# Patient Record
Sex: Male | Born: 1960
Health system: Southern US, Community
[De-identification: ages and names within clinical notes are randomized; demographics above are authoritative.]

## PROBLEM LIST (undated history)

## (undated) DIAGNOSIS — E119 Type 2 diabetes mellitus without complications: Secondary | ICD-10-CM

## (undated) DIAGNOSIS — G4730 Sleep apnea, unspecified: Secondary | ICD-10-CM

## (undated) HISTORY — DX: Type 2 diabetes mellitus without complications: E11.9

## (undated) HISTORY — PX: ROTATOR CUFF REPAIR: SHX139

---

## 1998-03-23 ENCOUNTER — Emergency Department (HOSPITAL_COMMUNITY): Admission: EM | Admit: 1998-03-23 | Discharge: 1998-03-23 | Payer: Self-pay | Admitting: Emergency Medicine

## 2000-09-26 ENCOUNTER — Observation Stay (HOSPITAL_COMMUNITY): Admission: RE | Admit: 2000-09-26 | Discharge: 2000-09-27 | Payer: Self-pay | Admitting: Specialist

## 2001-01-11 ENCOUNTER — Encounter: Payer: Self-pay | Admitting: Family Medicine

## 2001-01-11 ENCOUNTER — Encounter: Admission: RE | Admit: 2001-01-11 | Discharge: 2001-01-11 | Payer: Self-pay | Admitting: Family Medicine

## 2001-09-05 ENCOUNTER — Ambulatory Visit (HOSPITAL_COMMUNITY): Admission: RE | Admit: 2001-09-05 | Discharge: 2001-09-05 | Payer: Self-pay | Admitting: Specialist

## 2001-09-05 ENCOUNTER — Encounter: Payer: Self-pay | Admitting: Specialist

## 2005-03-17 ENCOUNTER — Encounter: Admission: RE | Admit: 2005-03-17 | Discharge: 2005-03-17 | Payer: Self-pay | Admitting: Family Medicine

## 2006-02-06 ENCOUNTER — Encounter: Admission: RE | Admit: 2006-02-06 | Discharge: 2006-02-06 | Payer: Self-pay | Admitting: Family Medicine

## 2012-07-23 ENCOUNTER — Emergency Department (HOSPITAL_COMMUNITY)
Admission: EM | Admit: 2012-07-23 | Discharge: 2012-07-23 | Disposition: A | Discharge status: Home / Self Care | Payer: Self-pay | Attending: Emergency Medicine | Admitting: Emergency Medicine

## 2012-07-23 DIAGNOSIS — M25531 Pain in right wrist: Secondary | ICD-10-CM

## 2012-07-23 DIAGNOSIS — G5601 Carpal tunnel syndrome, right upper limb: Secondary | ICD-10-CM

## 2012-07-23 DIAGNOSIS — Y9269 Other specified industrial and construction area as the place of occurrence of the external cause: Secondary | ICD-10-CM | POA: Insufficient documentation

## 2012-07-23 DIAGNOSIS — G5600 Carpal tunnel syndrome, unspecified upper limb: Secondary | ICD-10-CM | POA: Insufficient documentation

## 2012-07-23 DIAGNOSIS — Y990 Civilian activity done for income or pay: Secondary | ICD-10-CM | POA: Insufficient documentation

## 2012-07-23 DIAGNOSIS — X503XXA Overexertion from repetitive movements, initial encounter: Secondary | ICD-10-CM | POA: Insufficient documentation

## 2012-07-23 MED ORDER — IBUPROFEN 600 MG PO TABS: 600.0000 mg | ORAL_TABLET | Freq: Four times a day (QID) | ORAL | Status: DC | PRN

## 2012-07-23 NOTE — ED Provider Notes (Signed)
History     CSN: [id number]  Arrival date & time 07/23/12  1003   First MD Initiated Contact with Patient 07/23/12 1013      Chief Complaint  Patient presents with  . Wrist Pain    (Consider location/radiation/quality/duration/timing/severity/associated sxs/prior treatment) HPI Comments: Patient presents emergency department with chief complaint of right wrist pain. He states that he has had similar pain in the past. He describes it as intermittent and throbbing. He denies any fall or mechanical trauma or injury. He states that he has a hard time making a fist, and using his hand like normal. 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 There is no swelling, redness or deformity to the wrist. He states his pain is mild to moderate, but is worsened with repetitive movement. He has not tried anything to alleviate his symptoms.  The history is provided by the patient. No language interpreter was used.    No past medical history on file.  No past surgical history on file.  No family history on file.  History  Substance Use Topics  . Smoking status: Not on file  . Smokeless tobacco: Not on file  . Alcohol Use: Not on file      Review of Systems  All other systems reviewed and are negative.    Allergies  Review of patient's allergies indicates not on file.  Home Medications  No current outpatient prescriptions on file.  BP 142/95  Pulse 88  Temp(Src) 98.2 F (36.8 C) (Oral)  Resp 18  SpO2 99%  Physical Exam  Nursing note and vitals reviewed. Constitutional: He is oriented to person, place, and time. He appears well-developed and well-nourished.  HENT:  Head: Normocephalic and atraumatic.  Eyes: Conjunctivae and EOM are normal.  Neck: Normal range of motion.  Cardiovascular: Normal rate.   Distal pulses intact, with brisk capillary  Pulmonary/Chest: Effort normal.  Abdominal: He exhibits no distension.  Musculoskeletal: Normal range  of motion.  Positive Tinel and Phalen sign. No bony abnormality or deformity, no pain on palpation of the bones, hand flexion is painful, but he has full range of motion  Neurological: He is alert and oriented to person, place, and time.  Skin: Skin is dry.  No signs of rash, for redness, or any sign of infection  Psychiatric: He has a normal mood and affect. His behavior is normal. Judgment and thought content normal.    ED Course  Procedures (including critical care time)  Labs Reviewed - No data to display No results found.   1. Wrist pain, right   2. Carpal tunnel syndrome, right       MDM  Patient with right wrist pain. I suspect that this is carpal tunnel, based on the patient's occupation, and a positive Phalen and Tinel test, also there is no bony tenderness. Treat the patient with ibuprofen, and a wrist cockup splint. Will have the patient followup with hand.        Roxy Horseman, PA-C 07/23/12 1046

## 2012-07-23 NOTE — ED Notes (Signed)
Pt reports right wrist pain that started yesterday.  Pt reports hx of same with no diagnosis. Pain is intermittent and throbbing.  Pt reports he cannot make fist and cant use hand as normal.  No swelling noted, no redness no obvious disformity.  Pt denies injury.  Pt alert oriented X4

## 2012-07-24 NOTE — ED Provider Notes (Signed)
Medical screening examination/treatment/procedure(s) were performed by non-physician practitioner and as supervising physician I was immediately available for consultation/collaboration.  Zayden Hahne T Cherrish Vitali, MD 07/24/12 0822 

## 2012-10-08 ENCOUNTER — Encounter (HOSPITAL_COMMUNITY): Payer: Self-pay | Admitting: Emergency Medicine

## 2012-10-08 ENCOUNTER — Emergency Department (HOSPITAL_COMMUNITY): Payer: Self-pay

## 2012-10-08 ENCOUNTER — Emergency Department (HOSPITAL_COMMUNITY)
Admission: EM | Admit: 2012-10-08 | Discharge: 2012-10-08 | Disposition: A | Discharge status: Home / Self Care | Payer: Self-pay | Attending: Emergency Medicine | Admitting: Emergency Medicine

## 2012-10-08 DIAGNOSIS — M1710 Unilateral primary osteoarthritis, unspecified knee: Secondary | ICD-10-CM | POA: Insufficient documentation

## 2012-10-08 DIAGNOSIS — F17200 Nicotine dependence, unspecified, uncomplicated: Secondary | ICD-10-CM | POA: Insufficient documentation

## 2012-10-08 DIAGNOSIS — M25469 Effusion, unspecified knee: Secondary | ICD-10-CM | POA: Insufficient documentation

## 2012-10-08 DIAGNOSIS — Z8669 Personal history of other diseases of the nervous system and sense organs: Secondary | ICD-10-CM | POA: Insufficient documentation

## 2012-10-08 DIAGNOSIS — M25462 Effusion, left knee: Secondary | ICD-10-CM

## 2012-10-08 DIAGNOSIS — M1712 Unilateral primary osteoarthritis, left knee: Secondary | ICD-10-CM

## 2012-10-08 HISTORY — DX: Sleep apnea, unspecified: G47.30

## 2012-10-08 MED ORDER — HYDROCODONE-ACETAMINOPHEN 5-325 MG PO TABS: ORAL_TABLET | ORAL | Status: DC

## 2012-10-08 MED ORDER — LIDOCAINE HCL (PF) 1 % IJ SOLN
5.0000 mL | Freq: Once | INTRAMUSCULAR | Status: AC
  Administered 2012-10-08: 5 mL via INTRADERMAL
  Filled 2012-10-08: qty 5

## 2012-10-08 MED ORDER — HYDROCODONE-ACETAMINOPHEN 5-325 MG PO TABS
2.0000 | ORAL_TABLET | Freq: Once | ORAL | Status: AC
  Administered 2012-10-08: 2 via ORAL
  Filled 2012-10-08: qty 2

## 2012-10-08 NOTE — ED Notes (Signed)
Did  wound care on pt's lft knee.10:26am JG.

## 2012-10-08 NOTE — ED Notes (Signed)
Pt reports increased left knee pain over past 2-3 months.  Pt denies injury.  Pt states over the past 2 days the swelling has increased and pain 10/10.   Pt taken aleve with little relief.  Pt alert oriented X4

## 2012-10-08 NOTE — Discharge Instructions (Signed)

## 2012-10-08 NOTE — ED Provider Notes (Signed)
History     CSN: [id number]  Arrival date & time 10/08/12  0804   First MD Initiated Contact with Patient 10/08/12 [phone]      Chief Complaint  Patient presents with  . Knee Pain    (Consider location/radiation/quality/duration/timing/severity/associated sxs/prior treatment) Patient is a 52 y.o. male presenting with knee pain. The history is provided by the patient.  Knee Pain Location:  Knee Time since incident:  2 months Injury: no   Knee location:  L knee Pain details:    Quality:  Sharp and aching   Radiates to:  Does not radiate   Severity:  Severe   Onset quality:  Gradual   Timing:  Constant   Progression:  Worsening Chronicity:  New Dislocation: no   Prior injury to area:  No Relieved by:  Rest Worsened by:  Activity, bearing weight, flexion and extension Ineffective treatments:  NSAIDs Associated symptoms: decreased ROM and swelling   Associated symptoms: no back pain, no fever, no muscle weakness and no numbness   Risk factors: no concern for non-accidental trauma, no frequent fractures, no known bone disorder and no recent illness     Past Medical History  Diagnosis Date  . Sleep apnea     No past surgical history on file.  No family history on file.  History  Substance Use Topics  . Smoking status: Current Every Day Smoker    Types: Cigarettes  . Smokeless tobacco: Not on file  . Alcohol Use: Not on file      Review of Systems  Constitutional: Negative for fever.  Musculoskeletal: Positive for joint swelling and arthralgias. Negative for back pain.  Skin: Negative for color change, rash and wound.  Neurological: Negative for weakness and numbness.    Allergies  Review of patient's allergies indicates no known allergies.  Home Medications   Current Outpatient Rx  Name  Route  Sig  Dispense  Refill  . naproxen sodium (ALEVE) 220 MG tablet   Oral   Take 440 mg by mouth once.         Marland Kitchen HYDROcodone-acetaminophen (NORCO/VICODIN) 5-325  MG per tablet      1-2 tablets po q 6 hours prn moderate to severe pain   15 tablet   0     BP 127/72  Pulse 68  Temp(Src) 98 F (36.7 C) (Oral)  Resp 16  SpO2 95%  Physical Exam  Nursing note and vitals reviewed. Constitutional: He appears well-developed and well-nourished. No distress.  HENT:  Head: Normocephalic and atraumatic.  Cardiovascular:  Pulses:      Dorsalis pedis pulses are 2+ on the left side.  Pulmonary/Chest: Effort normal. No respiratory distress.  Musculoskeletal:       Left knee: He exhibits decreased range of motion, swelling and effusion. He exhibits no deformity, no laceration, no erythema and normal alignment. Tenderness found.  Neurological: He is alert. He exhibits normal muscle tone. Coordination normal.  Skin: Skin is warm and dry. No rash noted. He is not diaphoretic. No erythema. No pallor.    ED Course  ARTHOCENTESIS Date/Time: 10/08/2012 10:26 AM Performed by: Lear Ng Authorized by: Lear Ng Consent: Verbal consent obtained. Risks and benefits: risks, benefits and alternatives were discussed Consent given by: patient Patient understanding: patient states understanding of the procedure being performed Patient consent: the patient's understanding of the procedure matches consent given Procedure consent: procedure consent matches procedure scheduled Patient identity confirmed: verbally with patient Time out: Immediately prior to procedure a "  time out" was called to verify the correct patient, procedure, equipment, support staff and site/side marked as required. Indications: joint swelling and pain  Body area: knee Joint: left knee Local anesthesia used: yes Anesthesia: local infiltration Local anesthetic: lidocaine 1% without epinephrine Anesthetic total: 3 ml Patient sedated: no Preparation: Patient was prepped and draped in the usual sterile fashion. Needle gauge: 18 G Approach: lateral Aspirate: serous and  yellow Aspirate amount: 60 ml Patient tolerance: Patient tolerated the procedure well with no immediate complications.   (including critical care time)  Labs Reviewed - No data to display Dg Knee Complete 4 Views Left  10/08/2012   *RADIOLOGY REPORT*  Clinical Data: Knee pain for 2 weeks with swelling. No trauma history submitted.  LEFT KNEE - COMPLETE 4+ VIEW  Comparison: None.  Findings: Mild degenerative irregularity of the patellofemoral and lateral compartments.  Minimal medial compartment osteoarthritis. Moderate suprapatellar joint effusion.  Enthesopathic change at the quadriceps insertion.  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   Original Report Authenticated By: Jeronimo Greaves, M.D.     1. Knee effusion, left   2. Arthritis of knee, left     ra sat is 96% and I interpret to be normal   10:27 AM After arthrocentesis, pt feels much improved.    MDM  Pt with atraumatic left knee pain, swelling, likely effusion.  Will recommend RICE at home, Rx for analgesic.  If large effusion on xray is confirmed, will perform arthrocentesis for pain relief.          Gavin Pound. Austine Kelsay, MD 10/08/12 1030

## 2012-10-22 ENCOUNTER — Emergency Department (HOSPITAL_COMMUNITY)
Admission: EM | Admit: 2012-10-22 | Discharge: 2012-10-22 | Disposition: A | Discharge status: Home / Self Care | Payer: Self-pay | Attending: Emergency Medicine | Admitting: Emergency Medicine

## 2012-10-22 ENCOUNTER — Encounter (HOSPITAL_COMMUNITY): Payer: Self-pay | Admitting: Emergency Medicine

## 2012-10-22 DIAGNOSIS — Z791 Long term (current) use of non-steroidal anti-inflammatories (NSAID): Secondary | ICD-10-CM | POA: Insufficient documentation

## 2012-10-22 DIAGNOSIS — M25462 Effusion, left knee: Secondary | ICD-10-CM

## 2012-10-22 DIAGNOSIS — M25469 Effusion, unspecified knee: Secondary | ICD-10-CM | POA: Insufficient documentation

## 2012-10-22 DIAGNOSIS — F17200 Nicotine dependence, unspecified, uncomplicated: Secondary | ICD-10-CM | POA: Insufficient documentation

## 2012-10-22 MED ORDER — TRIAMCINOLONE ACETONIDE 40 MG/ML IJ SUSP
40.0000 mg | Freq: Once | INTRAMUSCULAR | Status: AC
  Administered 2012-10-22: 40 mg via INTRA_ARTICULAR
  Filled 2012-10-22: qty 1

## 2012-10-22 MED ORDER — HYDROCODONE-ACETAMINOPHEN 5-325 MG PO TABS
2.0000 | ORAL_TABLET | Freq: Once | ORAL | Status: AC
  Administered 2012-10-22: 2 via ORAL
  Filled 2012-10-22: qty 2

## 2012-10-22 MED ORDER — NAPROXEN 500 MG PO TABS: 500.0000 mg | ORAL_TABLET | Freq: Two times a day (BID) | ORAL | Status: DC

## 2012-10-22 MED ORDER — LIDOCAINE HCL 2 % IJ SOLN
10.0000 mL | Freq: Once | INTRAMUSCULAR | Status: AC
  Administered 2012-10-22: 200 mg
  Filled 2012-10-22: qty 20

## 2012-10-22 NOTE — ED Provider Notes (Signed)
History    This chart was scribed for non-physician practitioner Dierdre Forth, PA working with Gerhard Munch, MD by Quintella Reichert, ED Scribe. This patient was seen in room TR05C/TR05C and the patient's care was started at 4:33 PM   CSN: [id number]  Arrival date & time 10/22/12  1419    Chief Complaint  Patient presents with  . Knee Pain    The history is provided by the patient. No language interpreter was used.     HPI Comments: John Martinez is a 52 y.o. male who presents to the Emergency Department complaining of gradual-onset, constant, gradually-worsening moderate left knee pain that began yesterday, with accompanying visible swelling.  Pain is described as throbbing and is exacerbated by straightening the leg and by bearing weight.  Pt has experienced similar pain intermittently for 5 months and was seen at the ED several weeks ago for the same pain.  At that visit he was diagnosed with an effusion and arthritis to the knee, and was treated with arthrocentesis, which he states relieved pain significantly.  He has not been taking anti-inflammatory medication since that visit; he has not followed up with an orthopedist since that visit either..  Pt notes that he stands up all day at work.  He denies h/o gout, gonorrhea or septic joint to his knowledge.    Past Medical History  Diagnosis Date  . Sleep apnea     History reviewed. No pertinent past surgical history.   No family history on file.   History  Substance Use Topics  . Smoking status: Current Every Day Smoker    Types: Cigarettes  . Smokeless tobacco: Not on file  . Alcohol Use: Not on file     Review of Systems  HENT: Negative for neck stiffness.   Musculoskeletal: Positive for joint swelling and arthralgias.  Skin: Negative for wound.  Neurological: Negative for numbness.  All other systems reviewed and are negative.      Allergies  Review of patient's allergies indicates no known  allergies.  Home Medications   Current Outpatient Rx  Name  Route  Sig  Dispense  Refill  . naproxen (NAPROSYN) 500 MG tablet   Oral   Take 1 tablet (500 mg total) by mouth 2 (two) times daily with a meal.   30 tablet   3     BP 127/82  Pulse 96  Temp(Src) 98.3 F (36.8 C) (Oral)  Resp 17  SpO2 95%  Physical Exam  Nursing note and vitals reviewed. Constitutional: He is oriented to person, place, and time. He appears well-developed and well-nourished. No distress.  HENT:  Head: Normocephalic and atraumatic.  Eyes: Conjunctivae are normal.  Neck: Normal range of motion.  Cardiovascular: Normal rate, regular rhythm and intact distal pulses.   Capillary refill <3 seconds  Pulmonary/Chest: Effort normal and breath sounds normal.  Musculoskeletal: He exhibits tenderness. He exhibits no edema.  Decreased ROM of left knee  Neurological: He is alert and oriented to person, place, and time. He exhibits normal muscle tone. Coordination normal.  Sensation Intact Strength 5/5 to left lower extremity, except for left knee which has strength 3/5 secondary to pain  Skin: Skin is warm and dry. No rash noted. He is not diaphoretic. No erythema.  No tenting of the skin No erythema or increased warmth of the left knee  Psychiatric: He has a normal mood and affect.    ED Course  ARTHOCENTESIS Date/Time: 10/22/2012 5:45 PM Performed by: Dierdre Forth Authorized  by: Dierdre Forth Consent: Verbal consent obtained. Risks and benefits: risks, benefits and alternatives were discussed Consent given by: patient Patient understanding: patient states understanding of the procedure being performed Patient consent: the patient's understanding of the procedure matches consent given Procedure consent: procedure consent matches procedure scheduled Relevant documents: relevant documents present and verified Site marked: the operative site was marked Imaging studies: imaging studies  available (from last encounter) Required items: required blood products, implants, devices, and special equipment available Patient identity confirmed: verbally with patient and arm band Time out: Immediately prior to procedure a "time out" was called to verify the correct patient, procedure, equipment, support staff and site/side marked as required. Indications: joint swelling and pain  Body area: knee Joint: left knee Local anesthesia used: yes Local anesthetic: co-phenylcaine spray Patient sedated: no Preparation: Patient was prepped and draped in the usual sterile fashion. Needle gauge: 18 G Approach: lateral Aspirate: serous and yellow Aspirate amount: 65 ml Triamcinolone amount: 40 mg Lidocaine 2% amount: 8 ml Patient tolerance: Patient tolerated the procedure well with no immediate complications.   (including critical care time)  DIAGNOSTIC STUDIES: Oxygen Saturation is 95% on room air, adequate by my interpretation.    COORDINATION OF CARE: 4:41 PM- Informed pt that symptoms are due to effusion.  Discussed treatment plan which includes arthrocentesis and Naprosyn with pt at bedside and pt agreed to plan.     Labs Reviewed - No data to display  No results found.  1. Knee effusion, left     MDM  John Martinez presents with recurrent knee effusion. Patient seen on 10/08/2012 with knee effusion and x-ray at that time showed mild 3 compartment osteoarthritis moderate joint effusion.  I personally reviewed the imaging tests through PACS system.  I reviewed available ER/hospitalization records through the EMR.  Patient without injury.  Pt with mild swelling to the joint spaces, knee swelling, tightness in the knee, and mildly restricted range of motion. Pt unable to perform full flexion of the knee.  Pt is without systemic symptoms, erythema or redness of the joint consistent with gout or septic joint.   Pain managed in ED. knee arthrocentesis performed with large aspirate  obtained and cortisone injection given. Patient with full range of motion including flexion and extension after cortisone administration. Patient with no history of diabetes. Pt advised to follow up with orthopedics if symptoms persist for further evaluation and treatment. Patient given brace while in ED, conservative therapy recommended and discussed. Patient will be dc home & is agreeable with above plan.  I have also discussed reasons to return immediately to the ER.  Patient expresses understanding and agrees with plan.  Dr. Gerhard Munch was consulted, evaluated this patient with me and agrees with the plan.       Dahlia Client Tishawn Friedhoff, PA-C 10/22/12 1752

## 2012-10-22 NOTE — ED Notes (Signed)
Pt. Stated, i got lt. Knee pain and swollen that started yesterday. Initial pain started 5 months ago.

## 2012-10-23 NOTE — ED Provider Notes (Signed)
  This was a shared visit with a mid-level provided (NP or PA).  Throughout the patient's course I was available for consultation/collaboration.  I saw the ECG (if appropriate), relevant labs and studies - I agree with the interpretation.  On my exam the patient was in no distress.  He did however have a notable left knee effusion.      Gerhard Munch, MD 10/23/12 [phone]

## 2013-01-06 ENCOUNTER — Emergency Department (HOSPITAL_COMMUNITY): Payer: Self-pay

## 2013-01-06 ENCOUNTER — Emergency Department (HOSPITAL_COMMUNITY)
Admission: EM | Admit: 2013-01-06 | Discharge: 2013-01-06 | Disposition: A | Discharge status: Home / Self Care | Payer: Self-pay | Attending: Emergency Medicine | Admitting: Emergency Medicine

## 2013-01-06 ENCOUNTER — Encounter (HOSPITAL_COMMUNITY): Payer: Self-pay | Admitting: Emergency Medicine

## 2013-01-06 DIAGNOSIS — F17200 Nicotine dependence, unspecified, uncomplicated: Secondary | ICD-10-CM | POA: Insufficient documentation

## 2013-01-06 DIAGNOSIS — M25462 Effusion, left knee: Secondary | ICD-10-CM

## 2013-01-06 DIAGNOSIS — M1710 Unilateral primary osteoarthritis, unspecified knee: Secondary | ICD-10-CM | POA: Insufficient documentation

## 2013-01-06 DIAGNOSIS — IMO0002 Reserved for concepts with insufficient information to code with codable children: Secondary | ICD-10-CM | POA: Insufficient documentation

## 2013-01-06 DIAGNOSIS — M1990 Unspecified osteoarthritis, unspecified site: Secondary | ICD-10-CM

## 2013-01-06 DIAGNOSIS — M25469 Effusion, unspecified knee: Secondary | ICD-10-CM | POA: Insufficient documentation

## 2013-01-06 DIAGNOSIS — Z79899 Other long term (current) drug therapy: Secondary | ICD-10-CM | POA: Insufficient documentation

## 2013-01-06 MED ORDER — TRIAMCINOLONE ACETONIDE 40 MG/ML IJ SUSP
40.0000 mg | Freq: Once | INTRAMUSCULAR | Status: AC
  Administered 2013-01-06: 40 mg via INTRA_ARTICULAR
  Filled 2013-01-06: qty 1

## 2013-01-06 MED ORDER — OXYCODONE-ACETAMINOPHEN 5-325 MG PO TABS
1.0000 | ORAL_TABLET | Freq: Once | ORAL | Status: AC
  Administered 2013-01-06: 1 via ORAL
  Filled 2013-01-06: qty 1

## 2013-01-06 MED ORDER — OXYCODONE-ACETAMINOPHEN 5-325 MG PO TABS: 1.0000 | ORAL_TABLET | ORAL | Status: DC | PRN

## 2013-01-06 NOTE — ED Notes (Signed)
Pt c/o left knee pain that is chronic in nature; pt denies injury but sts increased swelling; pt sts has had fluid drained off before

## 2013-01-06 NOTE — ED Notes (Signed)
Received report from off going RN

## 2013-01-06 NOTE — ED Provider Notes (Signed)
CSN: [id number]     Arrival date & time 01/06/13  1642 History   First MD Initiated Contact with Patient 01/06/13 1727     Chief Complaint  Patient presents with  . Knee Pain   (Consider location/radiation/quality/duration/timing/severity/associated sxs/prior Treatment) Patient is a 52 y.o. male presenting with knee pain. The history is provided by the patient. No language interpreter was used.  Knee Pain Location:  Knee Time since incident:  3 days Injury: no   Knee location:  L knee Pain details:    Quality:  Aching and burning   Radiates to:  Does not radiate   Severity:  Moderate   Onset quality:  Gradual Chronicity:  Recurrent Associated symptoms: swelling   Associated symptoms: no fever, no numbness and no tingling     Past Medical History  Diagnosis Date  . Sleep apnea    History reviewed. No pertinent past surgical history. History reviewed. No pertinent family history. History  Substance Use Topics  . Smoking status: Current Every Day Smoker    Types: Cigarettes  . Smokeless tobacco: Not on file  . Alcohol Use: Yes    Review of Systems  Constitutional: Negative for fever.  Musculoskeletal: Positive for joint swelling.  Neurological: Negative for weakness and numbness.  All other systems reviewed and are negative.    Allergies  Review of patient's allergies indicates no known allergies.  Home Medications   Current Outpatient Rx  Name  Route  Sig  Dispense  Refill  . naproxen (NAPROSYN) 500 MG tablet   Oral   Take 1 tablet (500 mg total) by mouth 2 (two) times daily with a meal.   30 tablet   3    BP 157/86  Pulse 100  Temp(Src) 98.6 F (37 C) (Oral)  Resp 18  SpO2 98% Physical Exam  Nursing note and vitals reviewed. Constitutional: He appears well-developed and well-nourished. No distress.  HENT:  Head: Normocephalic and atraumatic.  Eyes: Conjunctivae are normal. Pupils are equal, round, and reactive to light.  Cardiovascular: Regular  rhythm, normal heart sounds and intact distal pulses.   Pulmonary/Chest: Effort normal and breath sounds normal.  Musculoskeletal: Normal range of motion. He exhibits edema and tenderness.       Left knee: He exhibits swelling and effusion. He exhibits normal range of motion, no deformity and no erythema.    ED Course  ARTHOCENTESIS Date/Time: 01/06/2013 7:58 PM Performed by: Irish Elders Authorized by: Irish Elders Consent: Verbal consent obtained. Risks and benefits: risks, benefits and alternatives were discussed Consent given by: patient Patient understanding: patient states understanding of the procedure being performed Test results: test results available and properly labeled Site marked: the operative site was marked Imaging studies: imaging studies available Required items: required blood products, implants, devices, and special equipment available Patient identity confirmed: verbally with patient and arm band Time out: Immediately prior to procedure a "time out" was called to verify the correct patient, procedure, equipment, support staff and site/side marked as required. Indications: joint swelling  Body area: knee Joint: left knee Local anesthesia used: yes Anesthesia: local infiltration Local anesthetic: lidocaine 2% without epinephrine Anesthetic total: 3 ml Patient sedated: no Preparation: Patient was prepped and draped in the usual sterile fashion. Needle gauge: 18 G Approach: lateral Aspirate: serous Aspirate amount: 110 ml Triamcinolone amount: 40 mg Lidocaine 2% amount: 8 ml Patient tolerance: Patient tolerated the procedure well with no immediate complications.   (including critical care time) Labs Review Labs Reviewed - No data  to display Imaging Review Dg Knee Complete 4 Views Left  01/06/2013   *RADIOLOGY REPORT*  Clinical Data: Left knee pain and swelling.  LEFT KNEE - COMPLETE 4+ VIEW  Comparison: 10/08/2012  Findings: There is no fracture or  dislocation.  There is tricompartmental slight osteoarthritic spurring.  There is a prominent joint effusion.  IMPRESSION: Joint effusion.  Slight tricompartmental osteoarthritis.   Original Report Authenticated By: Francene Boyers, M.D.    MDM   1. Knee effusion, left   2. Osteoarthritis     Left knee effusion, recurrent. Good ROM, neurovascular intact. Ambulates without difficulty. Arthrocentesis uncomplicated with of clear, serous fluid withdrawn. Pt reports some reflief post procedure.  X-ray showed effusion and tricompartmental osteoarthritis. Referral and follow-up with Ortho, Dr. Francena Hanly.    Irish Elders, NP 01/06/13 2013

## 2013-01-09 NOTE — ED Provider Notes (Signed)
Medical screening examination/treatment/procedure(s) were performed by non-physician practitioner and as supervising physician I was immediately available for consultation/collaboration.    Leiby Pigeon J. Keysi Oelkers, MD 01/09/13 0434 

## 2013-07-26 ENCOUNTER — Ambulatory Visit: Payer: Self-pay | Attending: Internal Medicine

## 2013-10-04 ENCOUNTER — Ambulatory Visit: Payer: Self-pay | Admitting: Internal Medicine

## 2014-10-05 ENCOUNTER — Encounter (HOSPITAL_COMMUNITY): Payer: Self-pay | Admitting: Emergency Medicine

## 2014-10-05 ENCOUNTER — Emergency Department (HOSPITAL_COMMUNITY)
Admission: EM | Admit: 2014-10-05 | Discharge: 2014-10-06 | Disposition: A | Discharge status: Home / Self Care | Payer: Self-pay | Attending: Emergency Medicine | Admitting: Emergency Medicine

## 2014-10-05 DIAGNOSIS — Z720 Tobacco use: Secondary | ICD-10-CM | POA: Insufficient documentation

## 2014-10-05 DIAGNOSIS — Z8669 Personal history of other diseases of the nervous system and sense organs: Secondary | ICD-10-CM | POA: Insufficient documentation

## 2014-10-05 DIAGNOSIS — L259 Unspecified contact dermatitis, unspecified cause: Secondary | ICD-10-CM | POA: Insufficient documentation

## 2014-10-05 NOTE — ED Notes (Signed)
Pt from home with wife presents with "itchy bumps" all over both arms. 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 He reports he was wearing a short sleeved shirt while doing this and the skin on his arms were exposed to the dust and debris from the buildings.

## 2014-10-06 MED ORDER — DOXYCYCLINE HYCLATE 100 MG PO CAPS: 100.0000 mg | ORAL_CAPSULE | Freq: Two times a day (BID) | ORAL | Status: DC

## 2014-10-06 MED ORDER — DIPHENHYDRAMINE HCL 25 MG PO TABS: 25.0000 mg | ORAL_TABLET | Freq: Four times a day (QID) | ORAL | Status: DC

## 2014-10-06 MED ORDER — PREDNISONE 20 MG PO TABS: ORAL_TABLET | ORAL | Status: DC

## 2014-10-06 NOTE — Discharge Instructions (Signed)

## 2014-10-06 NOTE — ED Provider Notes (Signed)
CSN: [id number]     Arrival date & time 10/05/14  2348 History   First MD Initiated Contact with Patient 10/06/14 0010     Chief Complaint  Patient presents with  . Rash     (Consider location/radiation/quality/duration/timing/severity/associated sxs/prior Treatment) HPI   54 year old male with history of sleep apnea but no significant history of allergies presenting from home for evaluation of a rash. 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 This is a new job, he has been working for the past 3 weeks. Since starting a new job he has noticed a very itchy rash initially started in his right forearm but now he has noticed in both forearms, face and neck primary at a skin exposed area. He has tried Neosporin, camphor cream, and peroxide with minimal improvement. He denies having any fever, headache, throat swelling, trouble breathing, chest pain, shortness of breath, abdominal cramping, nausea vomiting diarrhea. Family member does not have similar rash. No other changes in diets, soap, or detergent, no new pets. He admits that he wear short-sleeved shirts while doing this and his skin does exposed to dust and debris from the building. No rash in his mouth, penis, rectum, trouble having bowel movement, or rashes lower extremities. He has not noticed any rash in the palms of his hands or soles of feet.  Past Medical History  Diagnosis Date  . Sleep apnea    Past Surgical History  Procedure Laterality Date  . Rotator cuff repair     History reviewed. No pertinent family history. History  Substance Use Topics  . Smoking status: Current Every Day Smoker    Types: Cigarettes  . Smokeless tobacco: Not on file  . Alcohol Use: Yes    Review of Systems  All other systems reviewed and are negative.     Allergies  Review of patient's allergies indicates no known allergies.  Home Medications   Prior to Admission medications   Medication Sig  Start Date End Date Taking? Authorizing Provider  ibuprofen (ADVIL,MOTRIN) 200 MG tablet Take 400 mg by mouth every 6 (six) hours as needed for moderate pain.   Yes Historical Provider, MD  neomycin-polymyxin-pramoxine (NEOSPORIN PLUS) 1 % cream Apply 1 application topically 3 (three) times daily as needed (wound card).   Yes Historical Provider, MD  Pramoxine-Benzalkonium Cl (FIRST AID ANTISEPTIC) 1-0.13 % LIQD Apply 1 application topically 3 (three) times daily as needed (wound card).   Yes Historical Provider, MD  oxyCODONE-acetaminophen (PERCOCET/ROXICET) 5-325 MG per tablet Take 1 tablet by mouth every 4 (four) hours as needed for pain. Patient not taking: Reported on 10/06/2014 01/06/13   Elisha Headland, NP   BP 157/93 mmHg  Pulse 81  Temp(Src) 97.8 F (36.6 C) (Oral)  Resp 18  SpO2 97% Physical Exam  Constitutional: He appears well-developed and well-nourished. No distress.  HENT:  Head: Atraumatic.  Mouth/Throat: Oropharynx is clear and moist.  Eyes: Conjunctivae are normal.  Neck: Neck supple.  Cardiovascular: Normal rate and regular rhythm.   Pulmonary/Chest: Effort normal and breath sounds normal. No respiratory distress. He has no wheezes.  Musculoskeletal: He exhibits edema (Left forearm and left hand edema secondary to rash.).  Neurological: He is alert.  Skin: Rash (Multiple small papular lesion noted throughout right and left forearm, face and neck in skin exposed area. Please refer to picture below.) noted.  Psychiatric: He has a normal mood and affect.  Nursing note and vitals reviewed.   ED Course  Procedures (including critical care time)  Patient here with a rash through skin exposed area consistence with contact dermatitis from recent job. I recommend avoid his environment. Patient will be placed on steroid, Benadryl, and antibiotic. Antibiotic given due to open skin at risk for cellulitis.  Pt recommend to trim nails short and avoid scratching. Dermatology referral  given as needed. Return precaution discussed. No evidence of anaphylactic reaction.          Labs Review Labs Reviewed - No data to display  Imaging Review No results found.   EKG Interpretation None      MDM   Final diagnoses:  Contact dermatitis    BP 157/93 mmHg  Pulse 81  Temp(Src) 97.8 F (36.6 C) (Oral)  Resp 18  SpO2 97%     Domenic Moras, PA-C 10/06/14 0030  Serita Grit, MD 10/06/14 [phone]

## 2014-10-24 ENCOUNTER — Emergency Department (HOSPITAL_COMMUNITY)
Admission: EM | Admit: 2014-10-24 | Discharge: 2014-10-25 | Disposition: A | Discharge status: Home / Self Care | Payer: Self-pay | Attending: Emergency Medicine | Admitting: Emergency Medicine

## 2014-10-24 ENCOUNTER — Encounter (HOSPITAL_COMMUNITY): Payer: Self-pay | Admitting: Emergency Medicine

## 2014-10-24 DIAGNOSIS — I10 Essential (primary) hypertension: Secondary | ICD-10-CM | POA: Insufficient documentation

## 2014-10-24 DIAGNOSIS — R739 Hyperglycemia, unspecified: Secondary | ICD-10-CM | POA: Insufficient documentation

## 2014-10-24 DIAGNOSIS — L259 Unspecified contact dermatitis, unspecified cause: Secondary | ICD-10-CM | POA: Insufficient documentation

## 2014-10-24 DIAGNOSIS — Z720 Tobacco use: Secondary | ICD-10-CM | POA: Insufficient documentation

## 2014-10-24 DIAGNOSIS — Z8669 Personal history of other diseases of the nervous system and sense organs: Secondary | ICD-10-CM | POA: Insufficient documentation

## 2014-10-24 DIAGNOSIS — Z792 Long term (current) use of antibiotics: Secondary | ICD-10-CM | POA: Insufficient documentation

## 2014-10-24 DIAGNOSIS — Z79899 Other long term (current) drug therapy: Secondary | ICD-10-CM | POA: Insufficient documentation

## 2014-10-24 LAB — CBC WITH DIFFERENTIAL/PLATELET
BASOS ABS: 0 10*3/uL (ref 0.0–0.1)
Basophils Relative: 0 % (ref 0–1)
EOS PCT: 4 % (ref 0–5)
Eosinophils Absolute: 0.2 10*3/uL (ref 0.0–0.7)
HCT: 39 % (ref 39.0–52.0)
Hemoglobin: 12.8 g/dL — ABNORMAL LOW (ref 13.0–17.0)
LYMPHS ABS: 1.8 10*3/uL (ref 0.7–4.0)
LYMPHS PCT: 29 % (ref 12–46)
MCH: 29.2 pg (ref 26.0–34.0)
MCHC: 32.8 g/dL (ref 30.0–36.0)
MCV: 89 fL (ref 78.0–100.0)
MONO ABS: 1.2 10*3/uL — AB (ref 0.1–1.0)
Monocytes Relative: 18 % — ABNORMAL HIGH (ref 3–12)
NEUTROS ABS: 3.1 10*3/uL (ref 1.7–7.7)
Neutrophils Relative %: 49 % (ref 43–77)
Platelets: 138 10*3/uL — ABNORMAL LOW (ref 150–400)
RBC: 4.38 MIL/uL (ref 4.22–5.81)
RDW: 14.3 % (ref 11.5–15.5)
WBC: 6.3 10*3/uL (ref 4.0–10.5)

## 2014-10-24 MED ORDER — MORPHINE SULFATE 2 MG/ML IJ SOLN
4.0000 mg | Freq: Once | INTRAMUSCULAR | Status: AC
  Administered 2014-10-25: 4 mg via INTRAVENOUS
  Filled 2014-10-24: qty 2

## 2014-10-24 MED ORDER — DIPHENHYDRAMINE HCL 50 MG/ML IJ SOLN
25.0000 mg | Freq: Once | INTRAMUSCULAR | Status: AC
  Administered 2014-10-25: 25 mg via INTRAVENOUS
  Filled 2014-10-24: qty 1

## 2014-10-24 MED ORDER — METHYLPREDNISOLONE SODIUM SUCC 125 MG IJ SOLR
125.0000 mg | Freq: Once | INTRAMUSCULAR | Status: AC
  Administered 2014-10-25: 125 mg via INTRAVENOUS
  Filled 2014-10-24: qty 2

## 2014-10-24 NOTE — ED Provider Notes (Signed)
Medical Screening Exam:  Pt with two weeks of bilateral arm and neck dermatitis, has taken doxycycline but unable to fill prednisone c/o continued bilateral arm weeping rash with itching and pain.   Pt with significant edema, weeping wet dermatitis (only left arm examined), pt appears uncomfortable.   Seen with nurse in triage, orders for IV steroids, pain medication, labs ordered.  Pt to be moved to main ED for full evaluation.    [place], PA-C 10/25/14 9833  Everlene Balls, MD 10/25/14 [phone]

## 2014-10-24 NOTE — ED Notes (Signed)
Bilateral fore arms wrapped in kling due to weeping

## 2014-10-24 NOTE — ED Notes (Signed)
Patient here with complaint of bilateral arm swelling, weeping, and pain. States that he may have been exposed to poison ivy or oak. Was seen at [facility] for similar complaint about 2 weeks ago was given prednisone and Doxycycline Rx. Patient filled and has taken most of the Abx, but did not take any of the prednisone as it was too expensive.

## 2014-10-25 LAB — BASIC METABOLIC PANEL
Anion gap: 10 (ref 5–15)
BUN: 11 mg/dL (ref 6–20)
CO2: 21 mmol/L — ABNORMAL LOW (ref 22–32)
CREATININE: 1.17 mg/dL (ref 0.61–1.24)
Calcium: 8.6 mg/dL — ABNORMAL LOW (ref 8.9–10.3)
Chloride: 105 mmol/L (ref 101–111)
GFR calc Af Amer: >60 mL/min (ref >60)
Glucose, Bld: 227 mg/dL — ABNORMAL HIGH (ref 65–99)
POTASSIUM: 3.6 mmol/L (ref 3.5–5.1)
Sodium: 136 mmol/L (ref 135–145)

## 2014-10-25 MED ORDER — HYDROXYZINE HCL 10 MG PO TABS
10.0000 mg | ORAL_TABLET | Freq: Once | ORAL | Status: AC
  Administered 2014-10-25: 10 mg via ORAL
  Filled 2014-10-25: qty 1

## 2014-10-25 MED ORDER — MORPHINE SULFATE 4 MG/ML IJ SOLN
4.0000 mg | Freq: Once | INTRAMUSCULAR | Status: AC
  Administered 2014-10-25: 4 mg via INTRAVENOUS
  Filled 2014-10-25: qty 1

## 2014-10-25 MED ORDER — PREDNISONE 10 MG PO TABS: ORAL_TABLET | ORAL | Status: DC

## 2014-10-25 MED ORDER — OXYCODONE-ACETAMINOPHEN 5-325 MG PO TABS: 1.0000 | ORAL_TABLET | ORAL | Status: DC | PRN

## 2014-10-25 MED ORDER — HYDROXYZINE HCL 10 MG PO TABS: 10.0000 mg | ORAL_TABLET | Freq: Four times a day (QID) | ORAL | Status: DC | PRN

## 2014-10-25 NOTE — ED Notes (Signed)
Jarrett Soho PA in room

## 2014-10-25 NOTE — Discharge Instructions (Signed)
1. Medications: Prednisone x 2 weeks, atarax or benadryl for itching, usual home medications 2. Treatment: rest, drink plenty of fluids, take medications as prescribed, use calamine lotion; keep wounds clean 3. Follow Up: Please followup with your primary doctor or [facility] as discussed in 2-3 days for discussion of your diagnoses and further evaluation after today's visit; if you do not have a primary care doctor use the resource guide provided to find one; followup with dermatology as needed; Return to the ER for difficulty breathing, return of allergic reaction or other concerning symptoms    Contact Dermatitis Contact dermatitis is a reaction to certain substances that touch the skin. Contact dermatitis can be either irritant contact dermatitis or allergic contact dermatitis. Irritant contact dermatitis does not require previous exposure to the substance for a reaction to occur.Allergic contact dermatitis only occurs if you have been exposed to the substance before. Upon a repeat exposure, your body reacts to the substance.  CAUSES  Many substances can cause contact dermatitis. Irritant dermatitis is most commonly caused by repeated exposure to mildly irritating substances, such as:  Makeup.  Soaps.  Detergents.  Bleaches.  Acids.  Metal salts, such as nickel. Allergic contact dermatitis is most commonly caused by exposure to:  Poisonous plants.  Chemicals (deodorants, shampoos).  Jewelry.  Latex.  Neomycin in triple antibiotic cream.  Preservatives in products, including clothing. SYMPTOMS  The area of skin that is exposed may develop:  Dryness or flaking.  Redness.  Cracks.  Itching.  Pain or a burning sensation.  Blisters. With allergic contact dermatitis, there may also be swelling in areas such as the eyelids, mouth, or genitals.  DIAGNOSIS  Your caregiver can usually tell what the problem is by doing a physical exam. In cases where the cause  is uncertain and an allergic contact dermatitis is suspected, a patch skin test may be performed to help determine the cause of your dermatitis. TREATMENT Treatment includes protecting the skin from further contact with the irritating substance by avoiding that substance if possible. Barrier creams, powders, and gloves may be helpful. Your caregiver may also recommend:  Steroid creams or ointments applied 2 times daily. For best results, soak the rash area in cool water for 20 minutes. Then apply the medicine. Cover the area with a plastic wrap. You can store the steroid cream in the refrigerator for a "chilly" effect on your rash. That may decrease itching. Oral steroid medicines may be needed in more severe cases.  Antibiotics or antibacterial ointments if a skin infection is present.  Antihistamine lotion or an antihistamine taken by mouth to ease itching.  Lubricants to keep moisture in your skin.  Burow's solution to reduce redness and soreness or to dry a weeping rash. Mix one packet or tablet of solution in 2 cups cool water. Dip a clean washcloth in the mixture, wring it out a bit, and put it on the affected area. Leave the cloth in place for 30 minutes. Do this as often as possible throughout the day.  Taking several cornstarch or baking soda baths daily if the area is too large to cover with a washcloth. Harsh chemicals, such as alkalis or acids, can cause skin damage that is like a burn. You should flush your skin for 15 to 20 minutes with cold water after such an exposure. You should also seek immediate medical care after exposure. Bandages (dressings), antibiotics, and pain medicine may be needed for severely irritated skin.  HOME CARE INSTRUCTIONS  Avoid the substance that caused your reaction.  Keep the area of skin that is affected away from hot water, soap, sunlight, chemicals, acidic substances, or anything else that would irritate your skin.  Do not scratch the rash.  Scratching may cause the rash to become infected.  You may take cool baths to help stop the itching.  Only take over-the-counter or prescription medicines as directed by your caregiver.  See your caregiver for follow-up care as directed to make sure your skin is healing properly. SEEK MEDICAL CARE IF:   Your condition is not better after 3 days of treatment.  You seem to be getting worse.  You see signs of infection such as swelling, tenderness, redness, soreness, or warmth in the affected area.  You have any problems related to your medicines. Document Released: 04/01/2000 Document Revised: 06/27/2011 Document Reviewed: 09/07/2010 [facility] Patient Information 2015 [place], [place]. This information is not intended to replace advice given to you by your health care provider. Make sure you discuss any questions you have with your health care provider.

## 2014-10-25 NOTE — ED Notes (Signed)
ABD pads placed over each arm from the elbow to beyond the wrist and wrapped in curlex.

## 2014-10-25 NOTE — ED Provider Notes (Signed)
CSN: [id number]     Arrival date & time 10/24/14  2254 History   First MD Initiated Contact with Patient 10/24/14 2345     Chief Complaint  Patient presents with  . Rash  . Arm Pain     (Consider location/radiation/quality/duration/timing/severity/associated sxs/prior Treatment) The history is provided by the patient and medical records. No language interpreter was used.     John Martinez is a 54 y.o. male  with a hx of sleep apnea presents to the Emergency Department complaining of gradual, persistent, progressively worsening rash to the bilateral arms and circumfrecially around his neck onset approx 3 weeks ago.  Patient was seen in the emergency department on 10/06/2014 and diagnosed with contact dermatitis. He was discharged home with doxycycline and prednisone. He reports that he filled doxycycline but could not afford to fill the prednisone as well. He reports that his arms had scabbed over and pealed along with his face, neck and ears but tonight they began to swell and weep profusely. He reports a 10/10 pain and intense itching.  No aggrating factors and calamine lotion alleviating.  he denies fever, chills, headache, pain, short of breath, abdominal pain, nausea, vomiting, diarrhea weakness, dizziness, syncope. Patient reports intense pain in his arms and neck.    Past Medical History  Diagnosis Date  . Sleep apnea    Past Surgical History  Procedure Laterality Date  . Rotator cuff repair     History reviewed. No pertinent family history. History  Substance Use Topics  . Smoking status: Current Every Day Smoker    Types: Cigarettes  . Smokeless tobacco: Not on file  . Alcohol Use: Yes    Review of Systems  Constitutional: Negative for fever, diaphoresis, appetite change, fatigue and unexpected weight change.  HENT: Negative for mouth sores.   Eyes: Negative for visual disturbance.  Respiratory: Negative for cough, chest tightness, shortness of breath and wheezing.     Cardiovascular: Negative for chest pain.  Gastrointestinal: Negative for nausea, vomiting, abdominal pain, diarrhea and constipation.  Endocrine: Negative for polydipsia, polyphagia and polyuria.  Genitourinary: Negative for dysuria, urgency, frequency and hematuria.  Musculoskeletal: Negative for back pain and neck stiffness.  Skin: Positive for rash.  Allergic/Immunologic: Negative for immunocompromised state.  Neurological: Negative for syncope, light-headedness and headaches.  Hematological: Does not bruise/bleed easily.  Psychiatric/Behavioral: Negative for sleep disturbance. The patient is not nervous/anxious.       Allergies  Review of patient's allergies indicates no known allergies.  Home Medications   Prior to Admission medications   Medication Sig Start Date End Date Taking? Authorizing Provider  diphenhydrAMINE (BENADRYL) 25 MG tablet Take 1 tablet (25 mg total) by mouth every 6 (six) hours. 10/06/14   Domenic Moras, PA-C  doxycycline (VIBRAMYCIN) 100 MG capsule Take 1 capsule (100 mg total) by mouth 2 (two) times daily. 10/06/14   Domenic Moras, PA-C  hydrOXYzine (ATARAX/VISTARIL) 10 MG tablet Take 1 tablet (10 mg total) by mouth every 6 (six) hours as needed for itching. 10/25/14   Adella Manolis, PA-C  ibuprofen (ADVIL,MOTRIN) 200 MG tablet Take 400 mg by mouth every 6 (six) hours as needed for moderate pain.    Historical Provider, MD  neomycin-polymyxin-pramoxine (NEOSPORIN PLUS) 1 % cream Apply 1 application topically 3 (three) times daily as needed (wound card).    Historical Provider, MD  oxyCODONE-acetaminophen (PERCOCET) 5-325 MG per tablet Take 1-2 tablets by mouth every 4 (four) hours as needed. 10/25/14   Yerania Chamorro, PA-C  Pramoxine-Benzalkonium  Cl (FIRST AID ANTISEPTIC) 1-0.13 % LIQD Apply 1 application topically 3 (three) times daily as needed (wound card).    Historical Provider, MD  predniSONE (DELTASONE) 10 MG tablet 6 tabs po daily x 3 days, then 4 tabs  x 3 days, then 3 tabs x 3 days, then 2 tab x 3 days, then 1 tabs x 3 days 10/25/14   Jarrett Soho Joelle [place], PA-C   BP 162/103 mmHg  Pulse 89  Temp(Src) 99.1 F (37.3 C) (Oral)  Resp 16  SpO2 100% Physical Exam  Constitutional: He is oriented to person, place, and time. He appears well-developed and well-nourished. No distress.  HENT:  Head: Normocephalic and atraumatic.  Right Ear: Tympanic membrane, external ear and ear canal normal.  Left Ear: Tympanic membrane, external ear and ear canal normal.  Nose: Nose normal. No mucosal edema or rhinorrhea.  Mouth/Throat: Uvula is midline. No uvula swelling. No oropharyngeal exudate, posterior oropharyngeal edema, posterior oropharyngeal erythema or tonsillar abscesses.  No swelling of the uvula or oropharynx   Eyes: Conjunctivae are normal.  Neck: Normal range of motion.  Patent airway No stridor; normal phonation Handling secretions without difficulty  Cardiovascular: Normal rate, normal heart sounds and intact distal pulses.   No murmur heard. Pulmonary/Chest: Effort normal and breath sounds normal. No stridor. No respiratory distress. He has no wheezes.  No wheezes or rhonchi  Abdominal: Soft. Bowel sounds are normal. There is no tenderness.  Musculoskeletal: Normal range of motion. He exhibits no edema.  Neurological: He is alert and oriented to person, place, and time.  Skin: Skin is warm and dry. Rash noted. He is not diaphoretic.  Weeping lesions noted to the bilateral arms and neck Mild excoriations - no induration or fluctuance to indicate secondary infection Swelling of the skin with serous drainage Warm and tender to touch  Psychiatric: He has a normal mood and affect.  Nursing note and vitals reviewed.            ED Course  Procedures (including critical care time) Labs Review Labs Reviewed  BASIC METABOLIC PANEL - Abnormal; Notable for the following:    CO2 21 (*)    Glucose, Bld 227 (*)    Calcium 8.6 (*)     All other components within normal limits  CBC WITH DIFFERENTIAL/PLATELET - Abnormal; Notable for the following:    Hemoglobin 12.8 (*)    Platelets 138 (*)    Monocytes Relative 18 (*)    Monocytes Absolute 1.2 (*)    All other components within normal limits    Imaging Review No results found.   EKG Interpretation None      MDM   Final diagnoses:  Contact dermatitis  Hyperglycemia  Essential hypertension    John Martinez presents with persistent contact dermatitis rash for approximately 3 weeks.  On exam arms are swollen, warm to touch and weeping serous fluid. Patient is afebrile. He's clearly uncomfortable in the bed in both pain and was severe itching. Hypertension noted that he denies chest pain or shortness of breath.  Patient given morphine, steroids, Benadryl and will reassess.  Basic labs reassuring and patient without leukocytosis. Glucose elevated at 227.  Patient noted to be hypertensive in the emergency department.  No signs of hypertensive urgency.    1:43 AM Patient with minimally improved symptoms at this time however there is no evidence of systemic infection.  No purulent drainage from the wounds.  Patient denies any difficulty breathing or swallowing.  Pt has a  patent airway without stridor and is handling secretions without difficulty; no angioedema. No blisters, no pustules, no warmth, no draining sinus tracts, no superficial abscesses, no bullous impetigo, no vesicles, no desquamation, no target lesions with dusky purpura or a central bulla. Not tender to touch. No concern for superimposed infection. No concern for SJS, TEN, TSS, tick borne illness, syphilis or other life-threatening condition. Will discharge home with 2 week course of steroids, Atarax needed for pruritis and pain control.  Discussed close follow-up with [facility] for referral to dermatology and evaluation and treatment of patient's hyperglycemia and hypertension.  No evidence of DKA or  hypertensive urgency tonight.    BP 162/103 mmHg  Pulse 89  Temp(Src) 99.1 F (37.3 C) (Oral)  Resp 16  SpO2 100%  The patient was discussed with and seen by Dr. Aline Brochure who agrees with the treatment plan.     Jarrett Soho Lilyauna Miedema, PA-C 10/25/14 0147  Pamella Pert, MD 10/25/14 1535

## 2014-10-25 NOTE — ED Notes (Signed)
Pt verbalized understanding of d/c instructions and has no further questions. Pt stable and in NAD upon d/c.

## 2015-07-15 ENCOUNTER — Encounter (HOSPITAL_COMMUNITY): Payer: Self-pay | Admitting: Emergency Medicine

## 2015-07-15 ENCOUNTER — Emergency Department (HOSPITAL_COMMUNITY)
Admission: EM | Admit: 2015-07-15 | Discharge: 2015-07-15 | Disposition: A | Discharge status: Home / Self Care | Payer: Self-pay | Attending: Emergency Medicine | Admitting: Emergency Medicine

## 2015-07-15 ENCOUNTER — Emergency Department (HOSPITAL_COMMUNITY): Payer: Self-pay

## 2015-07-15 DIAGNOSIS — Z792 Long term (current) use of antibiotics: Secondary | ICD-10-CM | POA: Insufficient documentation

## 2015-07-15 DIAGNOSIS — F17210 Nicotine dependence, cigarettes, uncomplicated: Secondary | ICD-10-CM | POA: Insufficient documentation

## 2015-07-15 DIAGNOSIS — Z8669 Personal history of other diseases of the nervous system and sense organs: Secondary | ICD-10-CM | POA: Insufficient documentation

## 2015-07-15 DIAGNOSIS — Z79899 Other long term (current) drug therapy: Secondary | ICD-10-CM | POA: Insufficient documentation

## 2015-07-15 DIAGNOSIS — M25462 Effusion, left knee: Secondary | ICD-10-CM | POA: Insufficient documentation

## 2015-07-15 DIAGNOSIS — M25562 Pain in left knee: Secondary | ICD-10-CM | POA: Insufficient documentation

## 2015-07-15 MED ORDER — HYDROCODONE-ACETAMINOPHEN 5-325 MG PO TABS
2.0000 | ORAL_TABLET | Freq: Once | ORAL | Status: AC
  Administered 2015-07-15: 2 via ORAL
  Filled 2015-07-15: qty 2

## 2015-07-15 MED ORDER — HYDROCODONE-ACETAMINOPHEN 5-325 MG PO TABS: 1.0000 | ORAL_TABLET | ORAL | Status: DC | PRN

## 2015-07-15 NOTE — ED Notes (Signed)
Pt sts left knee pain with swelling x weeks that is chronic in nature and sts has had knee drained here before; pt denies new injury

## 2015-07-15 NOTE — Discharge Instructions (Signed)
1. Medications: vicodin for pain, usual home medications 2. Treatment: rest, drink plenty of fluids, ice, elevate, wear knee sleeve 3. Follow Up: please followup with [facility] for discussion of your diagnoses and further evaluation after today's visit; if you do not have a primary care doctor use the phone number listed in your discharge paperwork to find one; please return to the ER for high fever, increased pain, numbness, swelling, new or worsening symptoms   Knee Pain Knee pain is a common problem. It can have many causes. The pain often goes away by following your doctor's home care instructions. Treatment for ongoing pain will depend on the cause of your pain. If your knee pain continues, more tests may be needed to diagnose your condition. Tests may include X-rays or other imaging studies of your knee. HOME CARE  Take medicines only as told by your doctor.  Rest your knee and keep it raised (elevated) while you are resting.  Do not do things that cause pain or make your pain worse.  Avoid activities where both feet leave the ground at the same time, such as running, jumping rope, or doing jumping jacks.  Apply ice to the knee area:  Put ice in a plastic bag.  Place a towel between your skin and the bag.  Leave the ice on for 20 minutes, 2-3 times a day.  Ask your doctor if you should wear an elastic knee support.  Sleep with a pillow under your knee.  Lose weight if you are overweight. Being overweight can make your knee hurt more.  Do not use any tobacco products, including cigarettes, chewing tobacco, or electronic cigarettes. If you need help quitting, ask your doctor. Smoking may slow the healing of any bone and joint problems that you may have. GET HELP IF:  Your knee pain does not stop, it changes, or it gets worse.  You have a fever along with knee pain.  Your knee gives out or locks up.  Your knee becomes more swollen. GET HELP RIGHT  AWAY IF:   Your knee feels hot to the touch.  You have chest pain or trouble breathing.   This information is not intended to replace advice given to you by your health care provider. Make sure you discuss any questions you have with your health care provider.   Document Released: 07/01/2008 Document Revised: 04/25/2014 Document Reviewed: 06/05/2013 Elsevier Interactive Patient Education [employer].

## 2015-07-15 NOTE — Care Management CHF Note (Signed)
ED CM consulted concerning follow up care. Patient presented to [facility] ED with knee pain. Patient is uninsured and without a PCP.  CM met patient at bedside to discuss assistance with follow up care, patient agreeable. Discussed the [facility] and services rendered, Appt scheduled at the [facility] at the [facility] for tomorrow 3/30 at [place]. Provided clinic information teach back done patient verbalized understanding, No further questions or concerns verbalized.

## 2015-07-15 NOTE — ED Notes (Signed)
Patient transported to X-ray 

## 2015-07-15 NOTE — ED Provider Notes (Signed)
CSN: [id number]     Arrival date & time 07/15/15  1534 History  By signing my name below, I, Altamease Oiler, attest that this documentation has been prepared under the direction and in the presence of [employer]. Electronically Signed: Altamease Oiler, ED Scribe. 07/15/2015 4:21 PM   Chief Complaint  Patient presents with  . Knee Pain    The history is provided by the patient. No language interpreter was used.    John Martinez is a 55 y.o. male who presents to the Emergency Department complaining of recurrent, constant, 10/10 in severity, left knee pain and swelling with onset "weeks ago". The pain is worse with movement and after working. OTC pain medication has provided insufficient pain relief at home. The swelling has improved over the last 3 days with using a Ace Bandage. In the past he has had similar symptoms related to arthritis and the knee has been drained. He has not been seen by orthopedics for the recurrent symptoms. Pt denies fever, chills, numbness, tingling, and weakness.   Past Medical History  Diagnosis Date  . Sleep apnea    Past Surgical History  Procedure Laterality Date  . Rotator cuff repair     History reviewed. No pertinent family history. Social History  Substance Use Topics  . Smoking status: Current Every Day Smoker    Types: Cigarettes  . Smokeless tobacco: None  . Alcohol Use: Yes      Review of Systems  Constitutional: Negative for fever and chills.  Musculoskeletal: Positive for arthralgias.       Left knee pain and swelling   Neurological: Negative for weakness and numbness.    Allergies  Review of patient's allergies indicates no known allergies.  Home Medications   Prior to Admission medications   Medication Sig Start Date End Date Taking? Authorizing Provider  diphenhydrAMINE (BENADRYL) 25 MG tablet Take 1 tablet (25 mg total) by mouth every 6 (six) hours. 10/06/14   Domenic Moras, PA-C  doxycycline (VIBRAMYCIN) 100 MG  capsule Take 1 capsule (100 mg total) by mouth 2 (two) times daily. 10/06/14   Domenic Moras, PA-C  HYDROcodone-acetaminophen (NORCO/VICODIN) 5-325 MG tablet Take 1 tablet by mouth every 4 (four) hours as needed. 07/15/15   Marella Chimes, PA-C  hydrOXYzine (ATARAX/VISTARIL) 10 MG tablet Take 1 tablet (10 mg total) by mouth every 6 (six) hours as needed for itching. 10/25/14   Hannah Muthersbaugh, PA-C  ibuprofen (ADVIL,MOTRIN) 200 MG tablet Take 400 mg by mouth every 6 (six) hours as needed for moderate pain.    Historical Provider, MD  neomycin-polymyxin-pramoxine (NEOSPORIN PLUS) 1 % cream Apply 1 application topically 3 (three) times daily as needed (wound card).    Historical Provider, MD  oxyCODONE-acetaminophen (PERCOCET) 5-325 MG per tablet Take 1-2 tablets by mouth every 4 (four) hours as needed. 10/25/14   Hannah Muthersbaugh, PA-C  Pramoxine-Benzalkonium Cl (FIRST AID ANTISEPTIC) 1-0.13 % LIQD Apply 1 application topically 3 (three) times daily as needed (wound card).    Historical Provider, MD  predniSONE (DELTASONE) 10 MG tablet 6 tabs po daily x 3 days, then 4 tabs x 3 days, then 3 tabs x 3 days, then 2 tab x 3 days, then 1 tabs x 3 days 10/25/14   Jarrett Soho Muthersbaugh, PA-C    BP 114/71 mmHg  Pulse 91  Temp(Src) 98.5 F (36.9 C) (Oral)  Resp 18  SpO2 95% Physical Exam  Constitutional: He is oriented to person, place, and time. He appears well-developed  and well-nourished. No distress.  HENT:  Head: Normocephalic and atraumatic.  Right Ear: External ear normal.  Left Ear: External ear normal.  Nose: Nose normal.  Eyes: Conjunctivae and EOM are normal. Right eye exhibits no discharge. Left eye exhibits no discharge. No scleral icterus.  Neck: Normal range of motion. Neck supple.  Cardiovascular: Normal rate, regular rhythm and intact distal pulses.   Pulmonary/Chest: Effort normal and breath sounds normal. No respiratory distress.  Musculoskeletal: He exhibits no edema or  tenderness.  Pain with ROM of left knee. Patient able to straight leg raise left lower extremity. No significant TTP. No erythema, edema, or heat.   Neurological: He is alert and oriented to person, place, and time. He has normal strength. No sensory deficit.  Skin: Skin is warm and dry. He is not diaphoretic.  Psychiatric: He has a normal mood and affect. His behavior is normal.  Nursing note and vitals reviewed.   ED Course  Procedures (including critical care time)  DIAGNOSTIC STUDIES: Oxygen Saturation is 95% on RA,  normal by my interpretation.    COORDINATION OF CARE: 4:19 PM Discussed treatment plan which includes left knee XR and pain medication with pt at bedside and pt agreed to plan.  Labs Review Labs Reviewed - No data to display  Imaging Review Dg Knee Complete 4 Views Left  07/15/2015  CLINICAL DATA:  Chronic left knee pain EXAM: LEFT KNEE - COMPLETE 4+ VIEW COMPARISON:  01/06/2013 FINDINGS: Large is a suprapatellar joint effusion is identified. Degenerative changes noted including patellofemoral and medial compartment narrowing, marginal spur formation in sharpening of the tibial spines. IMPRESSION: 1. Joint effusion. 2. Osteoarthritis. Electronically Signed   By: Kerby Moors M.D.   On: 07/15/2015 16:49   I have personally reviewed and evaluated these images as part of my medical decision-making.   EKG Interpretation None      MDM   Final diagnoses:  Left knee pain    55 year old male presents with left knee pain and swelling, which he states has been ongoing for several weeks. He denies fever, chills, numbness, weakness, paresthesia, recent injury. Patient is afebrile. Vital signs stable. On exam, he has pain with range of motion of his left knee, otherwise no significant TTP, erythema, edema, or heat. Patient able to straight leg raise left lower extremity. Patient is neurovascularly intact. Will obtain imaging of left knee. Patient has been evaluated for this  several times in the past and has had multiple therapeutic arthrocenteses performed. He was instructed to follow-up with orthopedics for his symptoms, however has not done so.  Imaging remarkable for joint effusion and osteoarthritis. Discussed findings with patient. Low suspicion for septic joint or gout given no significant edema, erythema, or heat on exam. Will give knee sleeve. Patient to follow-up with orthopedics for further evaluation and management of recurrent effusion. Case management consulted regarding follow-up given patient states he has no insurance. Spoke with Mariann Laster, patient will follow-up at [facility] for orthopedic referral. Will give short course of pain medicine. Return precautions discussed. Patient verbalizes his understanding and is in agreement with plan.  BP 114/71 mmHg  Pulse 91  Temp(Src) 98.5 F (36.9 C) (Oral)  Resp 18  SpO2 95%    Marella Chimes, PA-C 07/15/15 1856  Sherwood Gambler, MD 07/21/15 1125

## 2015-07-16 ENCOUNTER — Ambulatory Visit: Payer: Self-pay | Attending: Physician Assistant | Admitting: Physician Assistant

## 2015-07-16 VITALS — BP 118/78 | HR 80 | Temp 98.1°F | Resp 18 | Ht 74.0 in | Wt 236.0 lb

## 2015-07-16 DIAGNOSIS — E119 Type 2 diabetes mellitus without complications: Secondary | ICD-10-CM

## 2015-07-16 DIAGNOSIS — R739 Hyperglycemia, unspecified: Secondary | ICD-10-CM

## 2015-07-16 DIAGNOSIS — M1712 Unilateral primary osteoarthritis, left knee: Secondary | ICD-10-CM | POA: Insufficient documentation

## 2015-07-16 DIAGNOSIS — M25462 Effusion, left knee: Secondary | ICD-10-CM

## 2015-07-16 DIAGNOSIS — M179 Osteoarthritis of knee, unspecified: Secondary | ICD-10-CM

## 2015-07-16 DIAGNOSIS — Z79899 Other long term (current) drug therapy: Secondary | ICD-10-CM | POA: Insufficient documentation

## 2015-07-16 DIAGNOSIS — G4730 Sleep apnea, unspecified: Secondary | ICD-10-CM | POA: Insufficient documentation

## 2015-07-16 LAB — LIPID PANEL
CHOLESTEROL: 152 mg/dL (ref 125–200)
HDL: 44 mg/dL (ref >=40)
LDL CALC: 87 mg/dL (ref <130)
TRIGLYCERIDES: 107 mg/dL (ref <150)
Total CHOL/HDL Ratio: 3.5 Ratio (ref <=5.0)
VLDL: 21 mg/dL (ref <30)

## 2015-07-16 LAB — CBC WITH DIFFERENTIAL/PLATELET
BASOS PCT: 1 % (ref 0–1)
Basophils Absolute: 0.1 10*3/uL (ref 0.0–0.1)
EOS ABS: 0.3 10*3/uL (ref 0.0–0.7)
EOS PCT: 3 % (ref 0–5)
HCT: 45.6 % (ref 39.0–52.0)
Hemoglobin: 15.6 g/dL (ref 13.0–17.0)
Lymphocytes Relative: 30 % (ref 12–46)
Lymphs Abs: 2.5 10*3/uL (ref 0.7–4.0)
MCH: 30 pg (ref 26.0–34.0)
MCHC: 34.2 g/dL (ref 30.0–36.0)
MCV: 87.7 fL (ref 78.0–100.0)
MONO ABS: 0.9 10*3/uL (ref 0.1–1.0)
MONOS PCT: 11 % (ref 3–12)
MPV: 9.6 fL (ref 8.6–12.4)
NEUTROS ABS: 4.6 10*3/uL (ref 1.7–7.7)
Neutrophils Relative %: 55 % (ref 43–77)
PLATELETS: 463 10*3/uL — AB (ref 150–400)
RBC: 5.2 MIL/uL (ref 4.22–5.81)
RDW: 14.8 % (ref 11.5–15.5)
WBC: 8.4 10*3/uL (ref 4.0–10.5)

## 2015-07-16 LAB — CMP AND LIVER
ALBUMIN: 3.7 g/dL (ref 3.6–5.1)
ALK PHOS: 77 U/L (ref 40–115)
ALT: 10 U/L (ref 9–46)
AST: 11 U/L (ref 10–35)
BILIRUBIN INDIRECT: 0.4 mg/dL (ref 0.2–1.2)
BILIRUBIN TOTAL: 0.5 mg/dL (ref 0.2–1.2)
BUN: 8 mg/dL (ref 7–25)
Bilirubin, Direct: 0.1 mg/dL (ref <=0.2)
CO2: 28 mmol/L (ref 20–31)
CREATININE: 0.95 mg/dL (ref 0.70–1.33)
Calcium: 8.8 mg/dL (ref 8.6–10.3)
Chloride: 103 mmol/L (ref 98–110)
GLUCOSE: 149 mg/dL — AB (ref 65–99)
POTASSIUM: 4.6 mmol/L (ref 3.5–5.3)
SODIUM: 142 mmol/L (ref 135–146)
Total Protein: 6.4 g/dL (ref 6.1–8.1)

## 2015-07-16 MED ORDER — METFORMIN HCL 500 MG PO TABS: 500.0000 mg | ORAL_TABLET | Freq: Two times a day (BID) | ORAL | Status: DC

## 2015-07-16 NOTE — Progress Notes (Signed)
Patient is here for FU ED for L Knee Pain  Patient complains of left knee pain scaled currently at a 4. Pain is intermittent described as aching.  Patient complains of right hand pain in middle and ring finger.  Patient declined flu shot.  Patient states he is not taking any medications nor has he eaten today.

## 2015-07-16 NOTE — Progress Notes (Signed)
John Martinez  [id number]  [id number]  DOB - 05-Jun-1960  Chief Complaint  Patient presents with  . Follow-up    knee pain       Subjective:   John Martinez is a 55 y.o. male here today for establishment of care. He was in the emergency room and on 07/15/2015 to 3 weeks of increasing left knee pain. It was worse with movement and after long periods of standing while working. He tried over-the-counter medications without relief. He started using an Ace wrap which helped the swelling. He has a history of this happening before always been to an orthopedic doctor and had it "drained". An x-ray done in emergency room showed a large suprapatellar joint effusion. He also has osteoarthritis. He was given anti-inflammatories and pain medications. He's had improvement in the swelling. He did not get the pain pills to stay off the knee and swelling has reduced.   He also notes he has been having some issues with blurred vision and swelling into the fingers on his right hand. He's recently been urinating as well. We did a glucose check it was 144. We did a hemoglobin A1c that was 7.6%. He denies chest pain. He denies dizziness or palpitations.  ROS: GEN: denies fever or chills, denies change in weight HEENT: denies headache, earache, epistaxis, sore throat, or neck pain +blurred vision LUNGS: denies SHOB, dyspnea, PND, orthopnea CV: denies CP or palpitations ABD: denies abd pain, N or V EXT: denies muscle spasms + swelling left knee; + pain in lower ext, no weakness NEURO: denies numbness or tingling, denies sz, stroke or TIA  Problem  Diabetes Mellitus (Hcc)  Effusion of Left Knee  Osteoarthritis of Left Knee    ALLERGIES: No Known Allergies  PAST MEDICAL HISTORY: Past Medical History  Diagnosis Date  . Sleep apnea     PAST SURGICAL HISTORY: Past Surgical History  Procedure Laterality Date  . Rotator cuff repair      MEDICATIONS AT HOME: Prior to Admission medications     Medication Sig Start Date End Date Taking? Authorizing Provider  diphenhydrAMINE (BENADRYL) 25 MG tablet Take 1 tablet (25 mg total) by mouth every 6 (six) hours. Patient not taking: Reported on 07/16/2015 10/06/14   Domenic Moras, PA-C  doxycycline (VIBRAMYCIN) 100 MG capsule Take 1 capsule (100 mg total) by mouth 2 (two) times daily. Patient not taking: Reported on 07/16/2015 10/06/14   Domenic Moras, PA-C  HYDROcodone-acetaminophen (NORCO/VICODIN) 5-325 MG tablet Take 1 tablet by mouth every 4 (four) hours as needed. Patient not taking: Reported on 07/16/2015 07/15/15   Marella Chimes, PA-C  hydrOXYzine (ATARAX/VISTARIL) 10 MG tablet Take 1 tablet (10 mg total) by mouth every 6 (six) hours as needed for itching. Patient not taking: Reported on 07/16/2015 10/25/14   Jarrett Soho Muthersbaugh, PA-C  ibuprofen (ADVIL,MOTRIN) 200 MG tablet Take 400 mg by mouth every 6 (six) hours as needed for moderate pain. Reported on 07/16/2015    Historical Provider, MD  neomycin-polymyxin-pramoxine (NEOSPORIN PLUS) 1 % cream Apply 1 application topically 3 (three) times daily as needed (wound card). Reported on 07/16/2015    Historical Provider, MD  oxyCODONE-acetaminophen (PERCOCET) 5-325 MG per tablet Take 1-2 tablets by mouth every 4 (four) hours as needed. Patient not taking: Reported on 07/16/2015 10/25/14   Jarrett Soho Muthersbaugh, PA-C  Pramoxine-Benzalkonium Cl (FIRST AID ANTISEPTIC) 1-0.13 % LIQD Apply 1 application topically 3 (three) times daily as needed (wound card). Reported on 07/16/2015    Historical Provider, MD  predniSONE (DELTASONE) 10 MG tablet 6 tabs po daily x 3 days, then 4 tabs x 3 days, then 3 tabs x 3 days, then 2 tab x 3 days, then 1 tabs x 3 days Patient not taking: Reported on 07/16/2015 10/25/14   Jarrett Soho Muthersbaugh, PA-C     Objective:   Filed Vitals:   07/16/15 0927  BP: 118/78  Pulse: 80  Temp: 98.1 F (36.7 C)  TempSrc: Oral  Resp: 18  Height: 6\' 2"  (1.88 m)  Weight: 236 lb (107.049 kg)   SpO2: 96%    Exam General appearance : Awake, alert, not in any distress. Speech Clear. Not toxic looking HEENT: Atraumatic and Normocephalic, pupils equally reactive to light and accomodation Neck: supple, no JVD. No cervical lymphadenopathy.  Chest:Good air entry bilaterally, no added sounds  CVS: S1 S2 regular, no murmurs.  Abdomen: Bowel sounds present, Non tender and not distended with no gaurding, rigidity or rebound. Extremities: B/L Lower Ext shows no significant edema today, both legs are warm to touch Neurology: Awake alert, and oriented X 3, CN II-XII intact, Non focal Skin:No Rash Wounds:N/A  Data Review A1C 7.6%; Glu 144  Assessment & Plan  1. Left knee effusion/Left knee OA  -Cont Knee sleeve and elevation  -NSAIDS as needed  -Ortho referral 2. New DM2  -Aim for 30 minutes of exercise most days. Rethink what you drink. Water is great! Aim for 2-3 Carb Choices per meal (30-45 grams) +/- 1 either way  Aim for 0-15 Carbs per snack if hungry  Include protein in moderation with your meals and snacks  Consider reading food labels for Total Carbohydrate and Fat Grams of foods  Consider checking BG at alternate times per day  Continue taking medication as directed Be mindful about how much sugar you are adding to beverages and other foods. Fruit Punch - find one with no sugar  Measure and decrease portions of carbohydrate foods  Make your plate and don't go back for seconds   -Metformin 500 mg BID  -CBC, CMP, lipids 3. Smoker  -Cessation discussed    Return in about 2 weeks (around 07/30/2015). for lab review and up titration of meds as appropriate.   The patient was given clear instructions to go to ER or return to medical center if symptoms don't improve, worsen or new problems develop. The patient verbalized understanding. The patient was told to call to get lab results if they haven't heard anything in the next week.   This note has been created with  Surveyor, quantity. Any transcriptional errors are unintentional.    Zettie Pho, PA-C [facility] [place], [place]   07/16/2015, 10:04 AM

## 2015-07-16 NOTE — Patient Instructions (Signed)
Watch sugar intake--sweet tea, soft drinks, carbs, desserts Exercise 30 min every day

## 2015-07-17 LAB — MICROALBUMIN / CREATININE URINE RATIO
CREATININE, URINE: 162 mg/dL (ref 20–370)
MICROALB UR: 0.7 mg/dL
MICROALB/CREAT RATIO: 4 ug/mg{creat} (ref <30)

## 2015-07-23 ENCOUNTER — Ambulatory Visit: Payer: Self-pay

## 2015-07-27 ENCOUNTER — Ambulatory Visit (INDEPENDENT_AMBULATORY_CARE_PROVIDER_SITE_OTHER): Payer: Self-pay | Admitting: Family Medicine

## 2015-07-27 ENCOUNTER — Encounter: Payer: Self-pay | Admitting: Family Medicine

## 2015-07-27 VITALS — BP 139/86 | HR 82 | Ht 74.0 in | Wt 236.0 lb

## 2015-07-27 DIAGNOSIS — M179 Osteoarthritis of knee, unspecified: Secondary | ICD-10-CM

## 2015-07-27 DIAGNOSIS — M1712 Unilateral primary osteoarthritis, left knee: Secondary | ICD-10-CM

## 2015-07-27 MED ORDER — METHYLPREDNISOLONE ACETATE 40 MG/ML IJ SUSP
40.0000 mg | Freq: Once | INTRAMUSCULAR | Status: AC
  Administered 2015-07-27: 40 mg via INTRA_ARTICULAR

## 2015-07-27 NOTE — Assessment & Plan Note (Signed)
Discussion. He did well after the aspiration but still having some pain. We also did go ahead and performed a corticosteroid injection today. Discussed frequency of these. He will follow-up when necessary. As long as he is getting good relief from every 3-6 month or 2 steroid injections, I don't feel we need to do any further workup. Should he start having continuous pain, I would evaluate for some meniscal pathology, arthroscope.

## 2015-07-27 NOTE — Progress Notes (Signed)
Patient ID: John Martinez, male   DOB: 22-Apr-1960, 55 y.o.   MRN: [id number]  John Martinez - 55 y.o. male MRN [id number]  Date of birth: Dec 08, 1960    SUBJECTIVE:     Chief Complaint: Left knee pain  HPI: Occurrence of left knee pain and effusion that was drained at his office visit in the emergency room on March 29. They did not give him corticosteroid injection. He has had 2 previous episodes of knee effusion both of which were drained in both of those episodes were associated with relief after corticosteroid injection.  Knee pain becomes more problematic as the knee gets more swollen. Even without those episodes he will occasionally have some sensation of locking or catching. He has some baseline aching 1-2 out of 10. Her episodes of effusion, stiffness is much worse. Pain today is 3 out of 10. ROS:     No recent unusual weight change, no fever, sweats, chills. He's noted no erythema of the left knee. Swelling has improved since the aspiration.  PERTINENT  PMH / PSH FH / / SH:  Past Medical, Surgical, Social, and Family History Reviewed & Updated in the EMR.  Pertinent findings include:  Personal history diabetes mellitus and osteoarthritis depressive the left knee. He reports never having left knee surgery.  OBJECTIVE: BP 139/86 mmHg  Pulse 82  Ht 6\' 2"  (1.88 m)  Wt 236 lb (107.049 kg)  BMI 30.29 kg/m2  Physical Exam:  Vital signs are reviewed. GEN.: Well-developed male no acute distress KNEES: Bilaterally are symmetrical. Left knee reveals no effusion. There is no erythema or warmth noted. He has full range of motion flexion extension. He has mild crepitus on extension. Ligamentously intact to varus and valgus stress. Normal Lockman. Popliteal space benign. Calf is soft. ULTRASOUND: Patellar and quadricep tendon are normal. He has some osteophytes seen at the edge of the patella as well as at the joint line both laterally and medially. Menisci look intact.  IMAGING: Completed 4 view of  left knee March 29. I reviewed the images and agree that there is some mild to moderate osteoarthritis with some osteophytes. The joint spaces difficult to evaluate because is a non-standing films but appears to be normal. At that time he did have a joint effusion.  INJECTION: Patient was given informed consent, signed copy in the chart. Appropriate time out was taken. Area prepped and draped in usual sterile fashion. 1 cc of methylprednisolone 40 mg/ml plus  4 cc of 1% lidocaine without epinephrine was injected into the left knee using a(n) anterior medial approach. The patient tolerated the procedure well. There were no complications. Post procedure instructions were given.   ASSESSMENT & PLAN:  See problem based charting & AVS for pt instructions.

## 2015-07-29 ENCOUNTER — Ambulatory Visit: Payer: Self-pay | Attending: Family Medicine | Admitting: Family Medicine

## 2015-07-29 ENCOUNTER — Ambulatory Visit: Payer: Self-pay

## 2015-07-29 ENCOUNTER — Encounter: Payer: Self-pay | Admitting: Family Medicine

## 2015-07-29 VITALS — BP 137/82 | HR 75 | Temp 97.5°F | Ht 74.0 in | Wt 242.6 lb

## 2015-07-29 DIAGNOSIS — Z720 Tobacco use: Secondary | ICD-10-CM | POA: Insufficient documentation

## 2015-07-29 DIAGNOSIS — F17200 Nicotine dependence, unspecified, uncomplicated: Secondary | ICD-10-CM

## 2015-07-29 DIAGNOSIS — Z7984 Long term (current) use of oral hypoglycemic drugs: Secondary | ICD-10-CM | POA: Insufficient documentation

## 2015-07-29 DIAGNOSIS — Z79899 Other long term (current) drug therapy: Secondary | ICD-10-CM | POA: Insufficient documentation

## 2015-07-29 DIAGNOSIS — E119 Type 2 diabetes mellitus without complications: Secondary | ICD-10-CM

## 2015-07-29 LAB — GLUCOSE, POCT (MANUAL RESULT ENTRY): POC GLUCOSE: 170 mg/dL — AB (ref 70–99)

## 2015-07-29 LAB — POCT GLYCOSYLATED HEMOGLOBIN (HGB A1C): HEMOGLOBIN A1C: 7.5

## 2015-07-29 MED ORDER — LISINOPRIL 2.5 MG PO TABS: 2.5000 mg | ORAL_TABLET | Freq: Every day | ORAL | Status: DC

## 2015-07-29 MED ORDER — TRUE METRIX METER DEVI: 1.0000 | Freq: Three times a day (TID) | Status: AC

## 2015-07-29 MED ORDER — TRUE METRIX METER DEVI: 1.0000 | Freq: Three times a day (TID) | Status: DC

## 2015-07-29 MED ORDER — GLUCOSE BLOOD VI STRP: ORAL_STRIP | Status: DC

## 2015-07-29 MED ORDER — TRUEPLUS LANCETS 28G MISC: 1.0000 | Freq: Three times a day (TID) | Status: AC

## 2015-07-29 MED ORDER — TRUEPLUS LANCETS 28G MISC: 1.0000 | Freq: Three times a day (TID) | Status: DC

## 2015-07-29 MED ORDER — LISINOPRIL 2.5 MG PO TABS
2.5000 mg | ORAL_TABLET | Freq: Every day | ORAL | Status: DC
Start: 2015-07-29 — End: 2017-03-22

## 2015-07-29 MED FILL — TRUE METRIX GLUCOSE TEST ST: 30 days supply | Qty: 100 | Fill #0

## 2015-07-29 MED FILL — LISINOPRIL 2.5 MG TABLET: 2.5 | 30 days supply | Qty: 30 | Fill #0

## 2015-07-29 MED FILL — TRUEplus LANCETS 28G MISC: 30 days supply | Qty: 100 | Fill #0

## 2015-07-29 MED FILL — TRUE METRIX BLOOD GLUCOSE M: W/DEVICE | 1 days supply | Qty: 1 | Fill #0

## 2015-07-29 NOTE — Progress Notes (Signed)
Subjective:  Patient ID: John Martinez, male    DOB: 09-Nov-1960  Age: 55 y.o. MRN: [id number]  CC: Establish Care   HPI John Martinez is a 55 year old male with history of type 2 diabetes mellitus (newly diagnosed with A1c of 7.5) who recently had a left knee aspiration and cortisone injection at sports medicine due to left knee pain and effusion reports significant improvement in the left knee.  He remains compliant with metformin and has no complaints at this time. He does not have a glucometer or test strips  Outpatient Prescriptions Prior to Visit  Medication Sig Dispense Refill  . metFORMIN (GLUCOPHAGE) 500 MG tablet Take 1 tablet (500 mg total) by mouth 2 (two) times daily with a meal. 180 tablet 3  . ibuprofen (ADVIL,MOTRIN) 200 MG tablet Take 400 mg by mouth every 6 (six) hours as needed for moderate pain. Reported on 07/29/2015    . neomycin-polymyxin-pramoxine (NEOSPORIN PLUS) 1 % cream Apply 1 application topically 3 (three) times daily as needed (wound card). Reported on 07/29/2015    . Pramoxine-Benzalkonium Cl (FIRST AID ANTISEPTIC) 1-0.13 % LIQD Apply 1 application topically 3 (three) times daily as needed (wound card). Reported on 07/29/2015     No facility-administered medications prior to visit.    ROS Review of Systems  Constitutional: Negative for activity change and appetite change.  HENT: Negative for sinus pressure and sore throat.   Eyes: Negative for visual disturbance.  Respiratory: Negative for cough, chest tightness and shortness of breath.   Cardiovascular: Negative for chest pain and leg swelling.  Gastrointestinal: Negative for abdominal pain, diarrhea, constipation and abdominal distention.  Endocrine: Negative.   Genitourinary: Negative for dysuria.  Musculoskeletal: Negative for myalgias and joint swelling.  Skin: Negative for rash.  Allergic/Immunologic: Negative.   Neurological: Negative for weakness, light-headedness and numbness.    Psychiatric/Behavioral: Negative for suicidal ideas and dysphoric mood.    Objective:  BP 137/82 mmHg  Pulse 75  Temp(Src) 97.5 F (36.4 C) (Oral)  Ht 6\' 2"  (1.88 m)  Wt 242 lb 9.6 oz (110.043 kg)  BMI 31.13 kg/m2  SpO2 97%  BP/Weight 07/29/2015 07/27/2015 [id number]  Systolic BP 0000000 [id number]  Diastolic BP 82 86 78  Wt. (Lbs) 242.6 236 236  BMI 31.13 30.29 30.29      Physical Exam  Constitutional: He is oriented to person, place, and time. He appears well-developed and well-nourished.  Cardiovascular: Normal rate, normal heart sounds and intact distal pulses.   No murmur heard. Pulmonary/Chest: Effort normal and breath sounds normal. He has no wheezes. He has no rales. He exhibits no tenderness.  Abdominal: Soft. Bowel sounds are normal. He exhibits no distension and no mass. There is no tenderness.  Musculoskeletal: Normal range of motion.  Neurological: He is alert and oriented to person, place, and time.    CMP Latest Ref Rng 07/16/2015 10/24/2014  Glucose 65 - 99 mg/dL 149(H) 227(H)  BUN 7 - 25 mg/dL 8 11  Creatinine 0.70 - 1.33 mg/dL 0.95 1.17  Sodium 135 - 146 mmol/L 142 136  Potassium 3.5 - 5.3 mmol/L 4.6 3.6  Chloride 98 - 110 mmol/L 103 105  CO2 20 - 31 mmol/L 28 21(L)  Calcium 8.6 - 10.3 mg/dL 8.8 8.6(L)  Total Protein 6.1 - 8.1 g/dL 6.4 -  Total Bilirubin 0.2 - 1.2 mg/dL 0.5 -  Alkaline Phos 40 - 115 U/L 77 -  AST 10 - 35 U/L 11 -  ALT 9 - 46  U/L 10 -     Lipid Panel     Component Value Date/Time   CHOL 152 07/16/2015 0946   TRIG 107 07/16/2015 0946   HDL 44 07/16/2015 0946   CHOLHDL 3.5 07/16/2015 0946   VLDL 21 07/16/2015 0946   LDLCALC 87 07/16/2015 0946      Lab Results  Component Value Date   HGBA1C 7.5 07/29/2015    Assessment & Plan:   1. Type 2 diabetes mellitus without complication, without long-term current use of insulin (HCC) Controlled with A1c of 7.5 Diabetic health care maintenance-foot exam, Pneumovax, microalbumin will be  addressed at next office visit; advised to schedule annual eye exam- discussed available resources in the community which are self pay as he has no coverage. Placed on low dose ace inhibitor Scheduled with the clinical pharmacists for education on diabetic diet Will review blood sugar log at next visit - Glucose (CBG) - HgB A1c - Blood Glucose Monitoring Suppl (TRUE METRIX METER) DEVI; 1 each by Does not apply route 3 (three) times daily before meals.  Dispense: 1 Device; Refill: 0 - glucose blood (TRUE METRIX BLOOD GLUCOSE TEST) test strip; Used 3 times daily as directed before meals  Dispense: 100 each; Refill: 12 - lisinopril (PRINIVIL,ZESTRIL) 2.5 MG tablet; Take 1 tablet (2.5 mg total) by mouth daily.  Dispense: 30 tablet; Refill: 3 - TRUEPLUS LANCETS 28G MISC; 1 each by Does not apply route 3 (three) times daily before meals.  Dispense: 100 each; Refill: 12  2. Tobacco use disorder Smoking cessation support: smoking cessation hotline: 1-800-QUIT-NOW.  Smoking cessation classes are available through [facility]. Call [phone] or visit our website at [email or web address]  Spent 3 minutes counseling on smoking cessation and patient is working on quitting.   Meds ordered this encounter  Medications  . DISCONTD: glucose blood (TRUE METRIX BLOOD GLUCOSE TEST) test strip    Sig: Used 3 times daily as directed before meals    Dispense:  100 each    Refill:  12  . DISCONTD: Blood Glucose Monitoring Suppl (TRUE METRIX METER) DEVI    Sig: 1 each by Does not apply route 3 (three) times daily before meals.    Dispense:  1 Device    Refill:  0  . DISCONTD: TRUEPLUS LANCETS 28G MISC    Sig: 1 each by Does not apply route 3 (three) times daily before meals.    Dispense:  100 each    Refill:  12  . DISCONTD: lisinopril (PRINIVIL,ZESTRIL) 2.5 MG tablet    Sig: Take 1 tablet (2.5 mg total) by mouth daily.    Dispense:  30 tablet    Refill:  3  . Blood Glucose  Monitoring Suppl (TRUE METRIX METER) DEVI    Sig: 1 each by Does not apply route 3 (three) times daily before meals.    Dispense:  1 Device    Refill:  0  . glucose blood (TRUE METRIX BLOOD GLUCOSE TEST) test strip    Sig: Used 3 times daily as directed before meals    Dispense:  100 each    Refill:  12  . lisinopril (PRINIVIL,ZESTRIL) 2.5 MG tablet    Sig: Take 1 tablet (2.5 mg total) by mouth daily.    Dispense:  30 tablet    Refill:  3  . TRUEPLUS LANCETS 28G MISC    Sig: 1 each by Does not apply route 3 (three) times daily before meals.    Dispense:  100 each    Refill:  12    Follow-up: Return in about 1 week (around 08/05/2015) for Diabetic education with Marzetta Board and one month follow-up of diabetes with Dr Jarold Song.   Arnoldo Morale MD

## 2015-07-29 NOTE — Progress Notes (Signed)
Patient here for follow up/establish care.  Patient was seen for left knee issues, received cortisone shot from orthopedic on Monday and reports his knee feels "great" now.  Patient denies any pain. Patient states he feels pretty good.   A1C 7.5  BS: 170

## 2015-07-29 NOTE — Patient Instructions (Signed)
Diabetes Mellitus and Food It is important for you to manage your blood sugar (glucose) level. Your blood glucose level can be greatly affected by what you eat. Eating healthier foods in the appropriate amounts throughout the day at about the same time each day will help you control your blood glucose level. It can also help slow or prevent worsening of your diabetes mellitus. Healthy eating may even help you improve the level of your blood pressure and reach or maintain a healthy weight.  General recommendations for healthful eating and cooking habits include:  Eating meals and snacks regularly. Avoid going long periods of time without eating to lose weight.  Eating a diet that consists mainly of plant-based foods, such as fruits, vegetables, nuts, legumes, and whole grains.  Using low-heat cooking methods, such as baking, instead of high-heat cooking methods, such as deep frying. Work with your dietitian to make sure you understand how to use the Nutrition Facts information on food labels. HOW CAN FOOD AFFECT ME? Carbohydrates Carbohydrates affect your blood glucose level more than any other type of food. Your dietitian will help you determine how many carbohydrates to eat at each meal and teach you how to count carbohydrates. Counting carbohydrates is important to keep your blood glucose at a healthy level, especially if you are using insulin or taking certain medicines for diabetes mellitus. Alcohol Alcohol can cause sudden decreases in blood glucose (hypoglycemia), especially if you use insulin or take certain medicines for diabetes mellitus. Hypoglycemia can be a life-threatening condition. Symptoms of hypoglycemia (sleepiness, dizziness, and disorientation) are similar to symptoms of having too much alcohol.  If your health care provider has given you approval to drink alcohol, do so in moderation and use the following guidelines:  Women should not have more than one drink per day, and men  should not have more than two drinks per day. One drink is equal to:  12 oz of beer.  5 oz of wine.  1 oz of hard liquor.  Do not drink on an empty stomach.  Keep yourself hydrated. Have water, diet soda, or unsweetened iced tea.  Regular soda, juice, and other mixers might contain a lot of carbohydrates and should be counted. WHAT FOODS ARE NOT RECOMMENDED? As you make food choices, it is important to remember that all foods are not the same. Some foods have fewer nutrients per serving than other foods, even though they might have the same number of calories or carbohydrates. It is difficult to get your body what it needs when you eat foods with fewer nutrients. Examples of foods that you should avoid that are high in calories and carbohydrates but low in nutrients include:  Trans fats (most processed foods list trans fats on the Nutrition Facts label).  Regular soda.  Juice.  Candy.  Sweets, such as cake, pie, doughnuts, and cookies.  Fried foods. WHAT FOODS CAN I EAT? Eat nutrient-rich foods, which will nourish your body and keep you healthy. The food you should eat also will depend on several factors, including:  The calories you need.  The medicines you take.  Your weight.  Your blood glucose level.  Your blood pressure level.  Your cholesterol level. You should eat a variety of foods, including:  Protein.  Lean cuts of meat.  Proteins low in saturated fats, such as fish, egg whites, and beans. Avoid processed meats.  Fruits and vegetables.  Fruits and vegetables that may help control blood glucose levels, such as apples, mangoes, and   yams.  Dairy products.  Choose fat-free or low-fat dairy products, such as milk, yogurt, and cheese.  Grains, bread, pasta, and rice.  Choose whole grain products, such as multigrain bread, whole oats, and brown rice. These foods may help control blood pressure.  Fats.  Foods containing healthful fats, such as nuts,  avocado, olive oil, canola oil, and fish. DOES EVERYONE WITH DIABETES MELLITUS HAVE THE SAME MEAL PLAN? Because every person with diabetes mellitus is different, there is not one meal plan that works for everyone. It is very important that you meet with a dietitian who will help you create a meal plan that is just right for you.   This information is not intended to replace advice given to you by your health care provider. Make sure you discuss any questions you have with your health care provider.   Document Released: 12/30/2004 Document Revised: 04/25/2014 Document Reviewed: 03/01/2013 Elsevier Interactive Patient Education 2016 [employer]  

## 2015-08-05 ENCOUNTER — Ambulatory Visit: Payer: Self-pay | Admitting: Pharmacist

## 2015-09-02 ENCOUNTER — Ambulatory Visit: Payer: Self-pay

## 2016-08-28 IMAGING — DX DG KNEE COMPLETE 4+V*L*
4 series · 4 of 4 positions shown · non-contrast
Comparison: 01/06/2013

CLINICAL DATA: Chronic left knee pain

EXAM:
LEFT KNEE - COMPLETE 4+ VIEW

[t knee ap left]
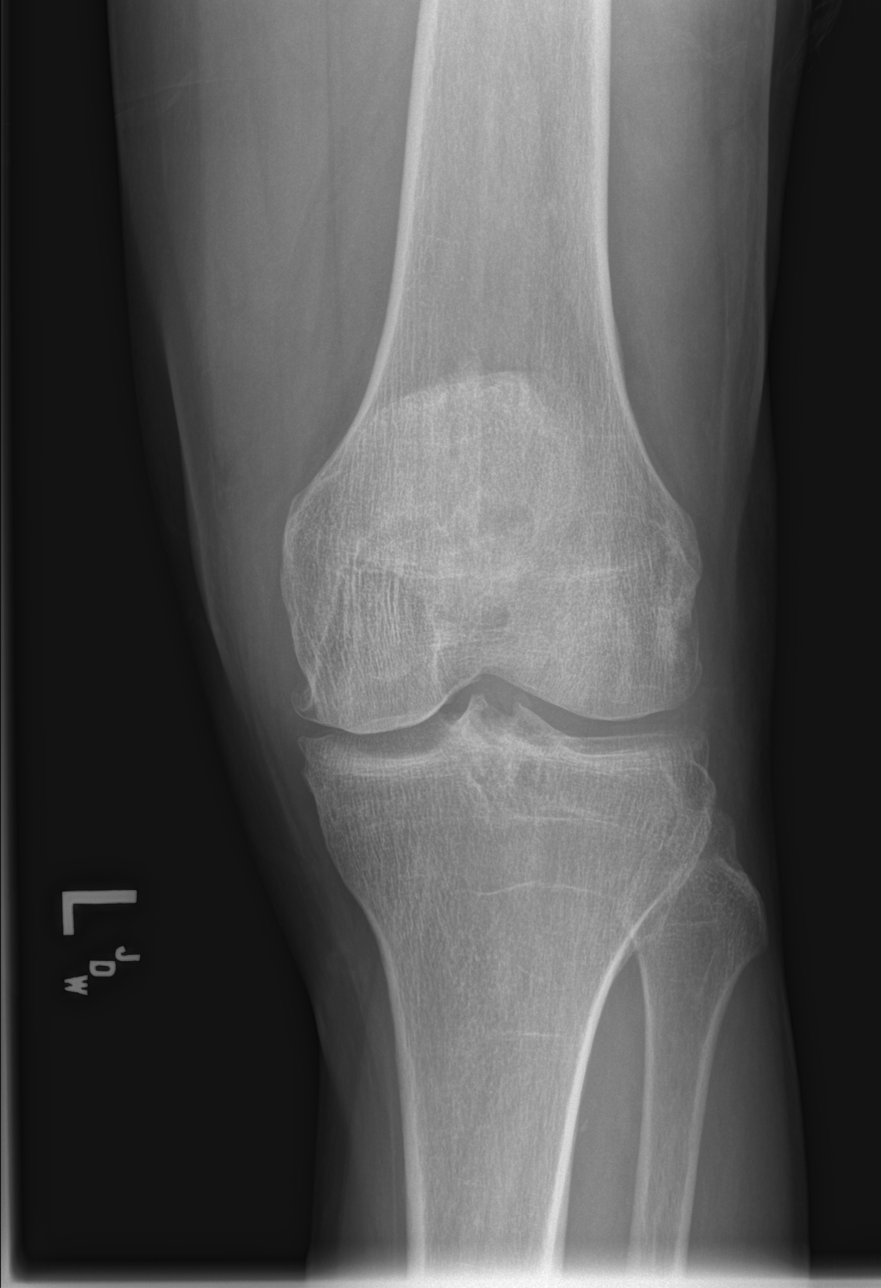

[t knee obl left (1 of 2)]
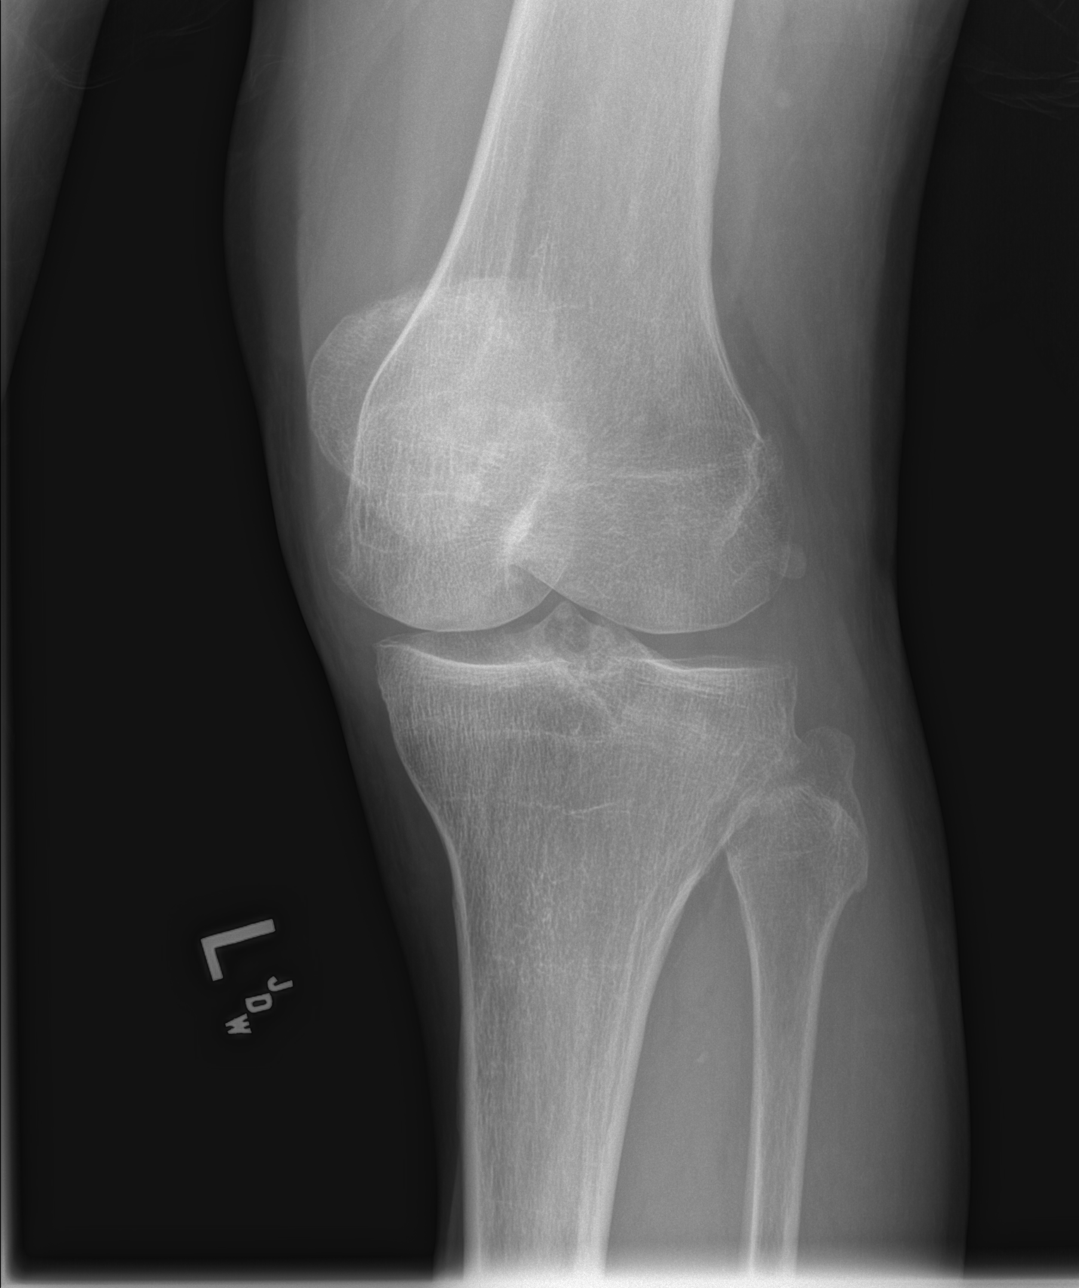

[t knee obl left (2 of 2)]
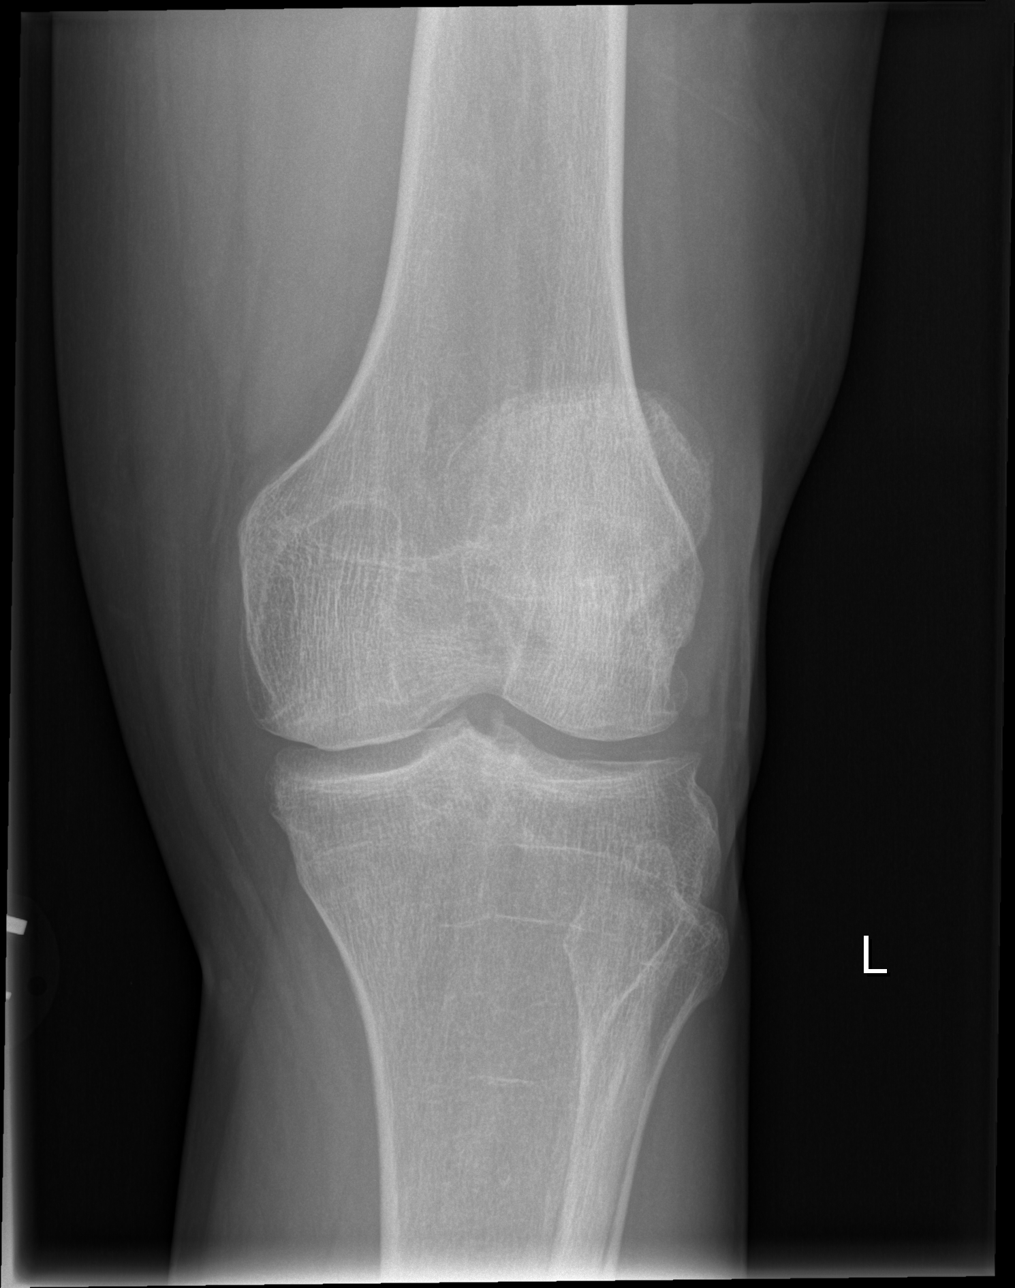

[t knee lat left]
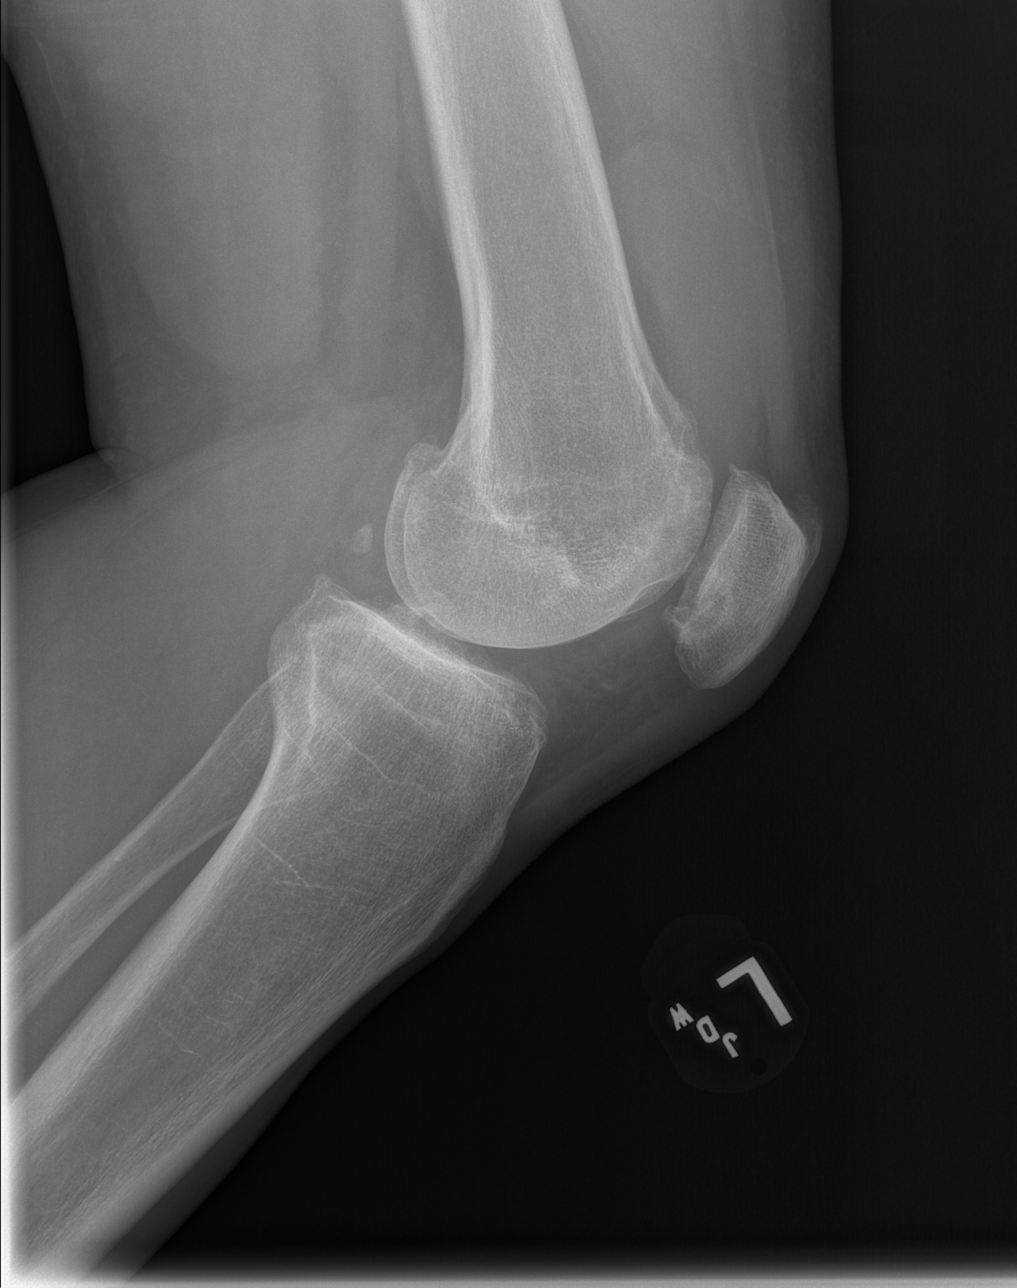

[4 of 4 positions shown; findings below may reference images not displayed]

FINDINGS: Large is a suprapatellar joint effusion is identified. Degenerative
changes noted including patellofemoral and medial compartment
narrowing, marginal spur formation in sharpening of the tibial
spines.
IMPRESSION: 1. Joint effusion.
2. Osteoarthritis.

## 2017-03-13 ENCOUNTER — Telehealth: Payer: Self-pay | Admitting: Family Medicine

## 2017-03-13 DIAGNOSIS — E119 Type 2 diabetes mellitus without complications: Secondary | ICD-10-CM

## 2017-03-13 NOTE — Telephone Encounter (Signed)
Pt called to request a refill for metFORMIN (GLUCOPHAGE) 500 MG tablet glucose blood (TRUE METRIX BLOOD GLUCOSE TEST) test strip [ Please sent it to the [facility] pharmacy

## 2017-03-14 MED ORDER — METFORMIN HCL 500 MG PO TABS: 500.0000 mg | ORAL_TABLET | Freq: Two times a day (BID) | ORAL | 0 refills | Status: DC

## 2017-03-14 MED ORDER — GLUCOSE BLOOD VI STRP: ORAL_STRIP | 12 refills | Status: AC

## 2017-03-14 MED FILL — ?METFORMIN HCL 500MG TABLET: 500 | 30 days supply | Qty: 60 | Fill #0

## 2017-03-14 MED FILL — TRUE METRIX TEST STRIP: 33 days supply | Qty: 100 | Fill #0

## 2017-03-14 NOTE — Telephone Encounter (Signed)
Refilled

## 2017-03-14 NOTE — Telephone Encounter (Signed)
Patient has not been seen since April 2017, will forward to PCP for approval. Appt scheduled with Dr. Jarold Song 03/22/17.

## 2017-03-22 ENCOUNTER — Encounter: Payer: Self-pay | Admitting: Family Medicine

## 2017-03-22 ENCOUNTER — Ambulatory Visit: Payer: Self-pay | Attending: Family Medicine | Admitting: Family Medicine

## 2017-03-22 VITALS — BP 118/72 | HR 77 | Temp 98.1°F | Ht 74.0 in | Wt 237.4 lb

## 2017-03-22 DIAGNOSIS — Z9111 Patient's noncompliance with dietary regimen: Secondary | ICD-10-CM | POA: Insufficient documentation

## 2017-03-22 DIAGNOSIS — Z9119 Patient's noncompliance with other medical treatment and regimen: Secondary | ICD-10-CM | POA: Insufficient documentation

## 2017-03-22 DIAGNOSIS — Z79899 Other long term (current) drug therapy: Secondary | ICD-10-CM | POA: Insufficient documentation

## 2017-03-22 DIAGNOSIS — M1712 Unilateral primary osteoarthritis, left knee: Secondary | ICD-10-CM | POA: Insufficient documentation

## 2017-03-22 DIAGNOSIS — R200 Anesthesia of skin: Secondary | ICD-10-CM | POA: Insufficient documentation

## 2017-03-22 DIAGNOSIS — E119 Type 2 diabetes mellitus without complications: Secondary | ICD-10-CM

## 2017-03-22 DIAGNOSIS — E1140 Type 2 diabetes mellitus with diabetic neuropathy, unspecified: Secondary | ICD-10-CM | POA: Insufficient documentation

## 2017-03-22 DIAGNOSIS — E1169 Type 2 diabetes mellitus with other specified complication: Secondary | ICD-10-CM | POA: Insufficient documentation

## 2017-03-22 DIAGNOSIS — E1149 Type 2 diabetes mellitus with other diabetic neurological complication: Secondary | ICD-10-CM

## 2017-03-22 DIAGNOSIS — M25531 Pain in right wrist: Secondary | ICD-10-CM | POA: Insufficient documentation

## 2017-03-22 DIAGNOSIS — Z7984 Long term (current) use of oral hypoglycemic drugs: Secondary | ICD-10-CM | POA: Insufficient documentation

## 2017-03-22 DIAGNOSIS — G4730 Sleep apnea, unspecified: Secondary | ICD-10-CM | POA: Insufficient documentation

## 2017-03-22 LAB — GLUCOSE, POCT (MANUAL RESULT ENTRY): POC GLUCOSE: 188 mg/dL — AB (ref 70–99)

## 2017-03-22 LAB — POCT GLYCOSYLATED HEMOGLOBIN (HGB A1C): HEMOGLOBIN A1C: 7.8

## 2017-03-22 MED ORDER — ATORVASTATIN CALCIUM 20 MG PO TABS: 20.0000 mg | ORAL_TABLET | Freq: Every day | ORAL | 6 refills | Status: DC

## 2017-03-22 MED ORDER — METFORMIN HCL 500 MG PO TABS: 1000.0000 mg | ORAL_TABLET | Freq: Two times a day (BID) | ORAL | 6 refills | Status: DC

## 2017-03-22 MED ORDER — GABAPENTIN 300 MG PO CAPS: 300.0000 mg | ORAL_CAPSULE | Freq: Two times a day (BID) | ORAL | 6 refills | Status: DC

## 2017-03-22 MED ORDER — IBUPROFEN 600 MG PO TABS: 600.0000 mg | ORAL_TABLET | Freq: Two times a day (BID) | ORAL | 1 refills | Status: DC | PRN

## 2017-03-22 MED ORDER — LISINOPRIL 2.5 MG PO TABS: 2.5000 mg | ORAL_TABLET | Freq: Every day | ORAL | 6 refills | Status: DC

## 2017-03-22 MED FILL — ATORVASTATIN 20 MG TABLET: 20 | 30 days supply | Qty: 30 | Fill #0

## 2017-03-22 MED FILL — LISINOPRIL 2.5 MG TABLET: 2.5 | 30 days supply | Qty: 30 | Fill #0

## 2017-03-22 MED FILL — IBUPROFEN 600 MG TABLET: 600 | 30 days supply | Qty: 60 | Fill #0

## 2017-03-22 MED FILL — ?METFORMIN HCL 500MG TABLET: 500 | 30 days supply | Qty: 120 | Fill #0

## 2017-03-22 MED FILL — GABAPENTIN 300 MG CAPSULE: 300 | 30 days supply | Qty: 60 | Fill #0

## 2017-03-22 NOTE — Patient Instructions (Signed)
Diabetes Mellitus and Food It is important for you to manage your blood sugar (glucose) level. Your blood glucose level can be greatly affected by what you eat. Eating healthier foods in the appropriate amounts throughout the day at about the same time each day will help you control your blood glucose level. It can also help slow or prevent worsening of your diabetes mellitus. Healthy eating may even help you improve the level of your blood pressure and reach or maintain a healthy weight. General recommendations for healthful eating and cooking habits include:  Eating meals and snacks regularly. Avoid going long periods of time without eating to lose weight.  Eating a diet that consists mainly of plant-based foods, such as fruits, vegetables, nuts, legumes, and whole grains.  Using low-heat cooking methods, such as baking, instead of high-heat cooking methods, such as deep frying.  Work with your dietitian to make sure you understand how to use the Nutrition Facts information on food labels. How can food affect me? Carbohydrates Carbohydrates affect your blood glucose level more than any other type of food. Your dietitian will help you determine how many carbohydrates to eat at each meal and teach you how to count carbohydrates. Counting carbohydrates is important to keep your blood glucose at a healthy level, especially if you are using insulin or taking certain medicines for diabetes mellitus. Alcohol Alcohol can cause sudden decreases in blood glucose (hypoglycemia), especially if you use insulin or take certain medicines for diabetes mellitus. Hypoglycemia can be a life-threatening condition. Symptoms of hypoglycemia (sleepiness, dizziness, and disorientation) are similar to symptoms of having too much alcohol. If your health care provider has given you approval to drink alcohol, do so in moderation and use the following guidelines:  Women should not have more than one drink per day, and men  should not have more than two drinks per day. One drink is equal to: ? 12 oz of beer. ? 5 oz of wine. ? 1 oz of hard liquor.  Do not drink on an empty stomach.  Keep yourself hydrated. Have water, diet soda, or unsweetened iced tea.  Regular soda, juice, and other mixers might contain a lot of carbohydrates and should be counted.  What foods are not recommended? As you make food choices, it is important to remember that all foods are not the same. Some foods have fewer nutrients per serving than other foods, even though they might have the same number of calories or carbohydrates. It is difficult to get your body what it needs when you eat foods with fewer nutrients. Examples of foods that you should avoid that are high in calories and carbohydrates but low in nutrients include:  Trans fats (most processed foods list trans fats on the Nutrition Facts label).  Regular soda.  Juice.  Candy.  Sweets, such as cake, pie, doughnuts, and cookies.  Fried foods.  What foods can I eat? Eat nutrient-rich foods, which will nourish your body and keep you healthy. The food you should eat also will depend on several factors, including:  The calories you need.  The medicines you take.  Your weight.  Your blood glucose level.  Your blood pressure level.  Your cholesterol level.  You should eat a variety of foods, including:  Protein. ? Lean cuts of meat. ? Proteins low in saturated fats, such as fish, egg whites, and beans. Avoid processed meats.  Fruits and vegetables. ? Fruits and vegetables that may help control blood glucose levels, such as apples,   mangoes, and yams.  Dairy products. ? Choose fat-free or low-fat dairy products, such as milk, yogurt, and cheese.  Grains, bread, pasta, and rice. ? Choose whole grain products, such as multigrain bread, whole oats, and brown rice. These foods may help control blood pressure.  Fats. ? Foods containing healthful fats, such as  nuts, avocado, olive oil, canola oil, and fish.  Does everyone with diabetes mellitus have the same meal plan? Because every person with diabetes mellitus is different, there is not one meal plan that works for everyone. It is very important that you meet with a dietitian who will help you create a meal plan that is just right for you. This information is not intended to replace advice given to you by your health care provider. Make sure you discuss any questions you have with your health care provider. Document Released: 12/30/2004 Document Revised: 09/10/2015 Document Reviewed: 03/01/2013 Elsevier Interactive Patient Education  2017 [employer].  

## 2017-03-23 ENCOUNTER — Encounter: Payer: Self-pay | Admitting: Family Medicine

## 2017-03-23 NOTE — Progress Notes (Signed)
Subjective:  Patient ID: John Martinez, male    DOB: 11-14-1960  Age: 56 y.o. MRN: [id number]  CC: Diabetes   HPI John Martinez 56 year old male with a history of type 2 diabetes mellitus (A1c 7.8 from today) who was last seen in the clinic in 07/2015 and presents today for a follow-up visit.  He does not take his metformin as prescribed and sometimes takes it every other day and also has not been compliant with a diabetic diet or exercise. His A1c has trended up from 7.5 previously to 7.8. He denies visual concerns but endorses numbness in the tip of his fingers.  He also complains of pain in his wrists and knees; he has a history of left knee osteoarthritis and would like to have something for it.   Pain is worse with the colder he denies swelling. He uses over-the-counter medications which have not helped.   Past Medical History:  Diagnosis Date  . Diabetes mellitus without complication ([place])   . Sleep apnea     Past Surgical History:  Procedure Laterality Date  . ROTATOR CUFF REPAIR      No Known Allergies   Outpatient Medications Prior to Visit  Medication Sig Dispense Refill  . Blood Glucose Monitoring Suppl (TRUE METRIX METER) DEVI 1 each by Does not apply route 3 (three) times daily before meals. 1 Device 0  . glucose blood (TRUE METRIX BLOOD GLUCOSE TEST) test strip Used 3 times daily as directed before meals 100 each 12  . metFORMIN (GLUCOPHAGE) 500 MG tablet Take 1 tablet (500 mg total) by mouth 2 (two) times daily with a meal. 60 tablet 0  . neomycin-polymyxin-pramoxine (NEOSPORIN PLUS) 1 % cream Apply 1 application topically 3 (three) times daily as needed (wound card). Reported on 07/29/2015    . Pramoxine-Benzalkonium Cl (FIRST AID ANTISEPTIC) 1-0.13 % LIQD Apply 1 application topically 3 (three) times daily as needed (wound card). Reported on 07/29/2015    . TRUEPLUS LANCETS 28G MISC 1 each by Does not apply route 3 (three) times daily before meals. (Patient not  taking: Reported on 03/22/2017) 100 each 12  . ibuprofen (ADVIL,MOTRIN) 200 MG tablet Take 400 mg by mouth every 6 (six) hours as needed for moderate pain. Reported on 07/29/2015    . lisinopril (PRINIVIL,ZESTRIL) 2.5 MG tablet Take 1 tablet (2.5 mg total) by mouth daily. (Patient not taking: Reported on 03/22/2017) 30 tablet 3   No facility-administered medications prior to visit.     ROS Review of Systems  Constitutional: Negative for activity change and appetite change.  HENT: Negative for sinus pressure and sore throat.   Eyes: Negative for visual disturbance.  Respiratory: Negative for cough, chest tightness and shortness of breath.   Cardiovascular: Negative for chest pain and leg swelling.  Gastrointestinal: Negative for abdominal distention, abdominal pain, constipation and diarrhea.  Endocrine: Negative.   Genitourinary: Negative for dysuria.  Musculoskeletal:       See hpi  Skin: Negative for rash.  Allergic/Immunologic: Negative.   Neurological: Negative for weakness, light-headedness and numbness.  Psychiatric/Behavioral: Negative for dysphoric mood and suicidal ideas.    Objective:  BP 118/72   Pulse 77   Temp 98.1 F (36.7 C) (Oral)   Ht _0  (1.88 m)   Wt 237 lb 6.4 oz (107.7 kg)   SpO2 98%   BMI 30.48 kg/m   BP/Weight 03/22/2017 07/29/2015 04/28/7354  Systolic BP 701 410 301  Diastolic BP 72 82 86  Wt. (Lbs)  237.4 242.6 236  BMI 30.48 31.13 30.29      Physical Exam  Constitutional: He is oriented to person, place, and time. He appears well-developed and well-nourished.  Cardiovascular: Normal rate, normal heart sounds and intact distal pulses.  No murmur heard. Pulmonary/Chest: Effort normal and breath sounds normal. He has no wheezes. He has no rales. He exhibits no tenderness.  Abdominal: Soft. Bowel sounds are normal. He exhibits no distension and no mass. There is no tenderness.  Musculoskeletal: Normal range of motion. He exhibits no edema or  tenderness.  Normal appearance of the hands and wrists, no tenderness to palpation Normal appearance of the knees, no tenderness to palpation  Neurological: He is alert and oriented to person, place, and time.  Skin: Skin is warm and dry.  Psychiatric: He has a normal mood and affect.     Lab Results  Component Value Date   HGBA1C 7.8 03/22/2017    Assessment & Plan:   1. Type 2 diabetes mellitus with other specified complication, without long-term current use of insulin (HCC) A1c of 7.8 which is above goal of less than 7.0 Increase metformin dose Diabetic diet Commenced on atorvastatin for primary prevention. - POCT glucose (manual entry) - POCT glycosylated hemoglobin (Hb A1C) - CMP14+EGFR; Future - Lipid panel; Future - Microalbumin/Creatinine Ratio, Urine; Future - Ambulatory referral to Podiatry - lisinopril (PRINIVIL,ZESTRIL) 2.5 MG tablet; Take 1 tablet (2.5 mg total) by mouth daily.  Dispense: 30 tablet; Refill: 6 - metFORMIN (GLUCOPHAGE) 500 MG tablet; Take 2 tablets (1,000 mg total) by mouth 2 (two) times daily with a meal.  Dispense: 120 tablet; Refill: 6 - atorvastatin (LIPITOR) 20 MG tablet; Take 1 tablet (20 mg total) by mouth daily.  Dispense: 30 tablet; Refill: 6  2. Osteoarthritis of left knee, unspecified osteoarthritis type Applying ice Use knee brace - ibuprofen (ADVIL,MOTRIN) 600 MG tablet; Take 1 tablet (600 mg total) by mouth every 12 (twelve) hours as needed for moderate pain. Reported on 07/29/2015  Dispense: 60 tablet; Refill: 1  3. Right wrist pain Likely secondary to osteoarthritis - ibuprofen (ADVIL,MOTRIN) 600 MG tablet; Take 1 tablet (600 mg total) by mouth every 12 (twelve) hours as needed for moderate pain. Reported on 07/29/2015  Dispense: 60 tablet; Refill: 1  4. Other diabetic neurological complication associated with type 2 diabetes mellitus ([place]) We will commence gabapentin-discussed sedating side effects - gabapentin (NEURONTIN) 300 MG  capsule; Take 1 capsule (300 mg total) by mouth 2 (two) times daily.  Dispense: 60 capsule; Refill: 6   Meds ordered this encounter  Medications  . lisinopril (PRINIVIL,ZESTRIL) 2.5 MG tablet    Sig: Take 1 tablet (2.5 mg total) by mouth daily.    Dispense:  30 tablet    Refill:  6  . metFORMIN (GLUCOPHAGE) 500 MG tablet    Sig: Take 2 tablets (1,000 mg total) by mouth 2 (two) times daily with a meal.    Dispense:  120 tablet    Refill:  6  . atorvastatin (LIPITOR) 20 MG tablet    Sig: Take 1 tablet (20 mg total) by mouth daily.    Dispense:  30 tablet    Refill:  6  . gabapentin (NEURONTIN) 300 MG capsule    Sig: Take 1 capsule (300 mg total) by mouth 2 (two) times daily.    Dispense:  60 capsule    Refill:  6  . ibuprofen (ADVIL,MOTRIN) 600 MG tablet    Sig: Take 1 tablet (600 mg total)  by mouth every 12 (twelve) hours as needed for moderate pain. Reported on 07/29/2015    Dispense:  60 tablet    Refill:  1    Follow-up: Return in about 3 months (around 06/20/2017) for follow up on diabetes mellitus.   Arnoldo Morale MD

## 2017-03-24 ENCOUNTER — Ambulatory Visit: Payer: Self-pay | Attending: Family Medicine

## 2017-03-24 DIAGNOSIS — E1169 Type 2 diabetes mellitus with other specified complication: Secondary | ICD-10-CM | POA: Insufficient documentation

## 2017-03-24 NOTE — Progress Notes (Signed)
Patient here for lab visit only 

## 2017-03-25 LAB — CMP14+EGFR
A/G RATIO: 1.4 (ref 1.2–2.2)
ALT: 11 IU/L (ref 0–44)
AST: 11 IU/L (ref 0–40)
Albumin: 3.7 g/dL (ref 3.5–5.5)
Alkaline Phosphatase: 80 IU/L (ref 39–117)
BUN/Creatinine Ratio: 9 (ref 9–20)
BUN: 10 mg/dL (ref 6–24)
CALCIUM: 8.5 mg/dL — AB (ref 8.7–10.2)
CO2: 26 mmol/L (ref 20–29)
Chloride: 103 mmol/L (ref 96–106)
Creatinine, Ser: 1.16 mg/dL (ref 0.76–1.27)
GFR, EST AFRICAN AMERICAN: 82 mL/min/{1.73_m2} (ref >59)
GFR, EST NON AFRICAN AMERICAN: 71 mL/min/{1.73_m2} (ref >59)
GLOBULIN, TOTAL: 2.7 g/dL (ref 1.5–4.5)
Glucose: 140 mg/dL — ABNORMAL HIGH (ref 65–99)
POTASSIUM: 4.8 mmol/L (ref 3.5–5.2)
SODIUM: 141 mmol/L (ref 134–144)
TOTAL PROTEIN: 6.4 g/dL (ref 6.0–8.5)

## 2017-03-25 LAB — LIPID PANEL
CHOLESTEROL TOTAL: 148 mg/dL (ref 100–199)
Chol/HDL Ratio: 3.2 ratio (ref 0.0–5.0)
HDL: 46 mg/dL (ref >39)
LDL CALC: 83 mg/dL (ref 0–99)
TRIGLYCERIDES: 95 mg/dL (ref 0–149)
VLDL Cholesterol Cal: 19 mg/dL (ref 5–40)

## 2017-03-30 ENCOUNTER — Telehealth: Payer: Self-pay

## 2017-03-30 NOTE — Telephone Encounter (Signed)
Pt was called and there is no VM set up to leave a message.   If patient returns phone call please inform pt that lab are normal.

## 2017-06-19 ENCOUNTER — Ambulatory Visit: Payer: Self-pay | Admitting: Family Medicine

## 2017-07-07 ENCOUNTER — Ambulatory Visit: Payer: Self-pay | Admitting: Family Medicine

## 2019-04-29 ENCOUNTER — Encounter: Payer: Self-pay | Admitting: Family Medicine

## 2019-04-29 ENCOUNTER — Ambulatory Visit: Payer: Self-pay | Attending: Family Medicine | Admitting: Family Medicine

## 2019-04-29 ENCOUNTER — Other Ambulatory Visit: Payer: Self-pay

## 2019-04-29 VITALS — BP 146/76 | HR 69 | Temp 98.2°F | Ht 74.0 in | Wt 243.0 lb

## 2019-04-29 DIAGNOSIS — E1169 Type 2 diabetes mellitus with other specified complication: Secondary | ICD-10-CM

## 2019-04-29 DIAGNOSIS — M1712 Unilateral primary osteoarthritis, left knee: Secondary | ICD-10-CM

## 2019-04-29 DIAGNOSIS — Z1159 Encounter for screening for other viral diseases: Secondary | ICD-10-CM

## 2019-04-29 DIAGNOSIS — M2559 Pain in other specified joint: Secondary | ICD-10-CM

## 2019-04-29 DIAGNOSIS — D1779 Benign lipomatous neoplasm of other sites: Secondary | ICD-10-CM

## 2019-04-29 DIAGNOSIS — M542 Cervicalgia: Secondary | ICD-10-CM

## 2019-04-29 LAB — POCT GLYCOSYLATED HEMOGLOBIN (HGB A1C): HbA1c, POC (controlled diabetic range): 7.6 % — AB (ref 0.0–7.0)

## 2019-04-29 LAB — GLUCOSE, POCT (MANUAL RESULT ENTRY): POC Glucose: 159 mg/dl — AB (ref 70–99)

## 2019-04-29 MED ORDER — LISINOPRIL 2.5 MG PO TABS: 2.5000 mg | ORAL_TABLET | Freq: Every day | ORAL | 6 refills | Status: DC

## 2019-04-29 MED ORDER — MELOXICAM 7.5 MG PO TABS: 7.5000 mg | ORAL_TABLET | Freq: Every day | ORAL | 3 refills | Status: DC

## 2019-04-29 MED ORDER — ATORVASTATIN CALCIUM 20 MG PO TABS: 20.0000 mg | ORAL_TABLET | Freq: Every day | ORAL | 6 refills | Status: DC

## 2019-04-29 MED ORDER — METFORMIN HCL 500 MG PO TABS: 1000.0000 mg | ORAL_TABLET | Freq: Two times a day (BID) | ORAL | 6 refills | Status: DC

## 2019-04-29 NOTE — Progress Notes (Signed)
Subjective:  Patient ID: John Martinez, male    DOB: 11/24/1960  Age: 59 y.o. MRN: [id number]  CC: Diabetes   HPI John Martinez is a 59 year old male with History of Type 2 DM (A1c 7.6), Hypertension last seen in the clinic in 2018 here for a follow up visit.  He has been on his medications intermittently, using his wife's Metformin for his Diabetes but not checking his sugars. Denies hypoglycemia or visual symptoms. Has also been without Lisinopril hence elevated blood pressure and not taking his statin.  He complains of pain in his neck, cracking sound, ankles ache constantly when he is standing on them. He has knee pain and L knee swells intermittently. Can barely use his R arm to lift (previous h/o R rotator cuff surgery). Also states he was dropped to the floor not long after his surgery. Denies tingling or numbness in hands. Symptoms have been present for years He has noticed a knot on his back which has been present for 6 month and is painful.  Past Medical History:  Diagnosis Date  . Diabetes mellitus without complication ([place])   . Sleep apnea     Past Surgical History:  Procedure Laterality Date  . ROTATOR CUFF REPAIR      No family history on file.  No Known Allergies  Outpatient Medications Prior to Visit  Medication Sig Dispense Refill  . atorvastatin (LIPITOR) 20 MG tablet Take 1 tablet (20 mg total) by mouth daily. (Patient not taking: Reported on 04/29/2019) 30 tablet 6  . Blood Glucose Monitoring Suppl (TRUE METRIX METER) DEVI 1 each by Does not apply route 3 (three) times daily before meals. (Patient not taking: Reported on 04/29/2019) 1 Device 0  . gabapentin (NEURONTIN) 300 MG capsule Take 1 capsule (300 mg total) by mouth 2 (two) times daily. (Patient not taking: Reported on 04/29/2019) 60 capsule 6  . glucose blood (TRUE METRIX BLOOD GLUCOSE TEST) test strip Used 3 times daily as directed before meals (Patient not taking: Reported on 04/29/2019) 100 each 12  .  ibuprofen (ADVIL,MOTRIN) 600 MG tablet Take 1 tablet (600 mg total) by mouth every 12 (twelve) hours as needed for moderate pain. Reported on 07/29/2015 (Patient not taking: Reported on 04/29/2019) 60 tablet 1  . lisinopril (PRINIVIL,ZESTRIL) 2.5 MG tablet Take 1 tablet (2.5 mg total) by mouth daily. (Patient not taking: Reported on 04/29/2019) 30 tablet 6  . metFORMIN (GLUCOPHAGE) 500 MG tablet Take 2 tablets (1,000 mg total) by mouth 2 (two) times daily with a meal. (Patient not taking: Reported on 04/29/2019) 120 tablet 6  . neomycin-polymyxin-pramoxine (NEOSPORIN PLUS) 1 % cream Apply 1 application topically 3 (three) times daily as needed (wound card). Reported on 07/29/2015    . Pramoxine-Benzalkonium Cl (FIRST AID ANTISEPTIC) 1-0.13 % LIQD Apply 1 application topically 3 (three) times daily as needed (wound card). Reported on 07/29/2015    . TRUEPLUS LANCETS 28G MISC 1 each by Does not apply route 3 (three) times daily before meals. (Patient not taking: Reported on 03/22/2017) 100 each 12   No facility-administered medications prior to visit.     ROS Review of Systems  Constitutional: Negative for activity change and appetite change.  HENT: Negative for sinus pressure and sore throat.   Eyes: Negative for visual disturbance.  Respiratory: Negative for cough, chest tightness and shortness of breath.   Cardiovascular: Negative for chest pain and leg swelling.  Gastrointestinal: Negative for abdominal distention, abdominal pain, constipation and diarrhea.  Endocrine: Negative.   Genitourinary: Negative for dysuria.  Musculoskeletal:       See hpi  Skin: Negative for rash.  Allergic/Immunologic: Negative.   Neurological: Negative for weakness, light-headedness and numbness.  Psychiatric/Behavioral: Negative for dysphoric mood and suicidal ideas.    Objective:  BP (!) 146/76   Pulse 69   Temp 98.2 F (36.8 C) (Oral)   Ht 6\' 2"  (1.88 m)   Wt 243 lb (110.2 kg)   SpO2 99%   BMI 31.20  kg/m   BP/Weight 04/29/2019 03/22/2017 99991111  Systolic BP 123456 123456 0000000  Diastolic BP 76 72 82  Wt. (Lbs) 243 237.4 242.6  BMI 31.2 30.48 31.13      Physical Exam Constitutional:      Appearance: He is well-developed.  Neck:     Vascular: No JVD.     Comments: Restricted left lateral motion Cardiovascular:     Rate and Rhythm: Normal rate.     Heart sounds: Normal heart sounds. No murmur.  Pulmonary:     Effort: Pulmonary effort is normal.     Breath sounds: Normal breath sounds. No wheezing or rales.  Chest:     Chest wall: No tenderness.  Abdominal:     General: Bowel sounds are normal. There is no distension.     Palpations: Abdomen is soft. There is no mass.     Tenderness: There is no abdominal tenderness.  Musculoskeletal:        General: Normal range of motion.     Cervical back: No tenderness.     Right lower leg: No edema.     Left lower leg: No edema.     Comments: TTP anterior R shoulder joint; forward elevation possible up to 50 degrees. Negative Hawkins and Neer's signs L shoulder is normal Normal hand grip b/l B/l knee with normal appearance, crepitus on ROM, no TTP Ankles are normal  Skin:    Comments: 4x6 cm soft tissue on posterior left scapular  Neurological:     Mental Status: He is alert and oriented to person, place, and time.  Psychiatric:        Mood and Affect: Mood normal.     CMP Latest Ref Rng & Units 03/24/2017 07/16/2015 10/24/2014  Glucose 65 - 99 mg/dL 140(H) 149(H) 227(H)  BUN 6 - 24 mg/dL 10 8 11   Creatinine 0.76 - 1.27 mg/dL 1.16 0.95 1.17  Sodium 134 - 144 mmol/L 141 142 136  Potassium 3.5 - 5.2 mmol/L 4.8 4.6 3.6  Chloride 96 - 106 mmol/L 103 103 105  CO2 20 - 29 mmol/L 26 28 21(L)  Calcium 8.7 - 10.2 mg/dL 8.5(L) 8.8 8.6(L)  Total Protein 6.0 - 8.5 g/dL 6.4 6.4 -  Total Bilirubin 0.0 - 1.2 mg/dL <0.2 0.5 -  Alkaline Phos 39 - 117 IU/L 80 77 -  AST 0 - 40 IU/L 11 11 -  ALT 0 - 44 IU/L 11 10 -    Lipid Panel       Component Value Date/Time   CHOL 148 03/24/2017 0918   TRIG 95 03/24/2017 0918   HDL 46 03/24/2017 0918   CHOLHDL 3.2 03/24/2017 0918   CHOLHDL 3.5 07/16/2015 0946   VLDL 21 07/16/2015 0946   LDLCALC 83 03/24/2017 0918    CBC    Component Value Date/Time   WBC 8.4 07/16/2015 0946   RBC 5.20 07/16/2015 0946   HGB 15.6 07/16/2015 0946   HCT 45.6 07/16/2015 0946   PLT 463 (  H) 07/16/2015 0946   MCV 87.7 07/16/2015 0946   MCH 30.0 07/16/2015 0946   MCHC 34.2 07/16/2015 0946   RDW 14.8 07/16/2015 0946   LYMPHSABS 2.5 07/16/2015 0946   MONOABS 0.9 07/16/2015 0946   EOSABS 0.3 07/16/2015 0946   BASOSABS 0.1 07/16/2015 0946    Lab Results  Component Value Date   HGBA1C 7.6 (A) 04/29/2019    Assessment & Plan:   1. Type 2 diabetes mellitus with other specified complication, without long-term current use of insulin (HCC) Not fully optimized with A1c of 7.6; goal is <7.0 This id due to partial compliance Metformin refilled Counseled on Diabetic diet, my plate method, [id number] minutes of moderate intensity exercise/week Blood sugar logs with fasting goals of 80-120 mg/dl, random of less than 180 and in the event of sugars less than 60 mg/dl or greater than 400 mg/dl encouraged to notify the clinic. Advised on the need for annual eye exams, annual foot exams, Pneumonia vaccine. - Glucose (CBG) - HgB A1c - atorvastatin (LIPITOR) 20 MG tablet; Take 1 tablet (20 mg total) by mouth daily.  Dispense: 30 tablet; Refill: 6 - lisinopril (ZESTRIL) 2.5 MG tablet; Take 1 tablet (2.5 mg total) by mouth daily.  Dispense: 30 tablet; Refill: 6 - metFORMIN (GLUCOPHAGE) 500 MG tablet; Take 2 tablets (1,000 mg total) by mouth 2 (two) times daily with a meal.  Dispense: 120 tablet; Refill: 6 - Complete Metabolic Panel with GFR - Microalbumin/Creatinine Ratio, Urine  2. Osteoarthritis of left knee, unspecified osteoarthritis type Uncontrolled Will commence NSAID  3. 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 Screening for viral disease - HIV antibody (with reflex) - Hepatitis c antibody (reflex)  5. Pain in other joint Osteoarthritis of multiple joints Placed on NSAIDS, PT if symptoms persist   No orders of the defined types were placed in this encounter.   Follow-up: Return in about 3 months (around 07/28/2019) for Chronic medical conditions.       Charlott Rakes, MD, FAAFP. [facility] and [place], [place]   04/29/2019, 4:24 PM

## 2019-04-29 NOTE — Progress Notes (Signed)
Having very bad joint pain. Right writ swelling. Neck pain. Knot on left side of back.

## 2019-04-29 NOTE — Patient Instructions (Signed)

## 2019-04-30 LAB — CMP14+EGFR
ALT: 15 IU/L (ref 0–44)
AST: 16 IU/L (ref 0–40)
Albumin/Globulin Ratio: 1.3 (ref 1.2–2.2)
Albumin: 4 g/dL (ref 3.8–4.9)
Alkaline Phosphatase: 92 IU/L (ref 39–117)
BUN/Creatinine Ratio: 10 (ref 9–20)
BUN: 11 mg/dL (ref 6–24)
Bilirubin Total: 0.3 mg/dL (ref 0.0–1.2)
CO2: 22 mmol/L (ref 20–29)
Calcium: 8.7 mg/dL (ref 8.7–10.2)
Chloride: 100 mmol/L (ref 96–106)
Creatinine, Ser: 1.13 mg/dL (ref 0.76–1.27)
GFR calc Af Amer: 84 mL/min/{1.73_m2} (ref >59)
GFR calc non Af Amer: 72 mL/min/{1.73_m2} (ref >59)
Globulin, Total: 3.2 g/dL (ref 1.5–4.5)
Glucose: 218 mg/dL — ABNORMAL HIGH (ref 65–99)
Potassium: 4.2 mmol/L (ref 3.5–5.2)
Sodium: 138 mmol/L (ref 134–144)
Total Protein: 7.2 g/dL (ref 6.0–8.5)

## 2019-04-30 LAB — MICROALBUMIN / CREATININE URINE RATIO
Creatinine, Urine: 169.4 mg/dL
Microalb/Creat Ratio: 2 mg/g creat (ref 0–29)
Microalbumin, Urine: 3.6 ug/mL

## 2019-04-30 LAB — HCV COMMENT:

## 2019-04-30 LAB — HIV ANTIBODY (ROUTINE TESTING W REFLEX): HIV Screen 4th Generation wRfx: NONREACTIVE

## 2019-04-30 LAB — HEPATITIS C ANTIBODY (REFLEX): HCV Ab: <0.1 s/co ratio (ref 0.0–0.9)

## 2019-04-30 MED FILL — metFORMIN HCL 500 MG TABS: 500 | 30 days supply | Qty: 120 | Fill #0

## 2019-04-30 MED FILL — ATORVASTATIN CALCIUM 20 MG: 20 | 30 days supply | Qty: 30 | Fill #0

## 2019-04-30 MED FILL — MELOXICAM 7.5 MG TABLET: 7.5 | 30 days supply | Qty: 30 | Fill #0

## 2019-04-30 MED FILL — LISINOPRIL 2.5 MG TABLET: 2.5 | 30 days supply | Qty: 30 | Fill #0

## 2019-05-02 ENCOUNTER — Telehealth: Payer: Self-pay

## 2019-05-02 NOTE — Telephone Encounter (Signed)
-----   Message from Charlott Rakes, MD sent at 04/30/2019  2:10 PM EST ----- Please inform the patient that labs are normal. Thank you.

## 2019-05-02 NOTE — Telephone Encounter (Signed)
Patient name and DOB has been verified Patient was informed of lab results. Patient had no questions.  

## 2019-05-06 ENCOUNTER — Ambulatory Visit (HOSPITAL_COMMUNITY)
Admission: RE | Admit: 2019-05-06 | Discharge: 2019-05-06 | Disposition: A | Discharge status: Home / Self Care | Payer: Self-pay | Source: Ambulatory Visit | Attending: Family Medicine | Admitting: Family Medicine

## 2019-05-06 ENCOUNTER — Other Ambulatory Visit: Payer: Self-pay

## 2019-05-06 DIAGNOSIS — D1779 Benign lipomatous neoplasm of other sites: Secondary | ICD-10-CM | POA: Insufficient documentation

## 2019-05-09 ENCOUNTER — Telehealth: Payer: Self-pay

## 2019-05-09 NOTE — Telephone Encounter (Signed)
Patient name and DOB has been verified Patient was informed of lab results. Patient had no questions.  

## 2019-05-09 NOTE — Telephone Encounter (Signed)
-----   Message from Charlott Rakes, MD sent at 05/07/2019 10:40 AM EST ----- CT does not detect malignancy in the scapular swelling but could be a fatty tissue

## 2019-05-27 ENCOUNTER — Ambulatory Visit: Payer: Self-pay

## 2019-07-25 ENCOUNTER — Ambulatory Visit: Payer: HRSA Program | Attending: Internal Medicine

## 2019-07-25 DIAGNOSIS — Z20822 Contact with and (suspected) exposure to covid-19: Secondary | ICD-10-CM | POA: Insufficient documentation

## 2019-07-26 LAB — NOVEL CORONAVIRUS, NAA: SARS-CoV-2, NAA: NOT DETECTED

## 2019-07-26 LAB — SARS-COV-2, NAA 2 DAY TAT

## 2019-07-31 ENCOUNTER — Encounter: Payer: Self-pay | Admitting: Family Medicine

## 2019-07-31 ENCOUNTER — Other Ambulatory Visit: Payer: Self-pay

## 2019-07-31 ENCOUNTER — Ambulatory Visit: Payer: Self-pay | Attending: Family Medicine | Admitting: Family Medicine

## 2019-07-31 VITALS — BP 113/73 | HR 70 | Ht 74.0 in | Wt 237.0 lb

## 2019-07-31 DIAGNOSIS — M1712 Unilateral primary osteoarthritis, left knee: Secondary | ICD-10-CM

## 2019-07-31 DIAGNOSIS — M25531 Pain in right wrist: Secondary | ICD-10-CM

## 2019-07-31 DIAGNOSIS — E1149 Type 2 diabetes mellitus with other diabetic neurological complication: Secondary | ICD-10-CM

## 2019-07-31 DIAGNOSIS — E1169 Type 2 diabetes mellitus with other specified complication: Secondary | ICD-10-CM

## 2019-07-31 LAB — POCT GLYCOSYLATED HEMOGLOBIN (HGB A1C): HbA1c, POC (controlled diabetic range): 7.6 % — AB (ref 0.0–7.0)

## 2019-07-31 LAB — GLUCOSE, POCT (MANUAL RESULT ENTRY): POC Glucose: 141 mg/dl — AB (ref 70–99)

## 2019-07-31 MED ORDER — GLIPIZIDE 5 MG PO TABS: 2.5000 mg | ORAL_TABLET | Freq: Two times a day (BID) | ORAL | 6 refills | Status: DC

## 2019-07-31 MED ORDER — GABAPENTIN 300 MG PO CAPS: 300.0000 mg | ORAL_CAPSULE | Freq: Two times a day (BID) | ORAL | 6 refills | Status: DC

## 2019-07-31 MED ORDER — MELOXICAM 7.5 MG PO TABS: 7.5000 mg | ORAL_TABLET | Freq: Every day | ORAL | 6 refills | Status: DC

## 2019-07-31 MED ORDER — METFORMIN HCL 500 MG PO TABS: 1000.0000 mg | ORAL_TABLET | Freq: Two times a day (BID) | ORAL | 6 refills | Status: DC

## 2019-07-31 MED ORDER — LISINOPRIL 2.5 MG PO TABS: 2.5000 mg | ORAL_TABLET | Freq: Every day | ORAL | 6 refills | Status: DC

## 2019-07-31 MED FILL — LISINOPRIL 2.5 MG TABLET: 2.5 | 30 days supply | Qty: 30 | Fill #0

## 2019-07-31 MED FILL — GABAPENTIN 300 MG CAPSULE: 300 | 30 days supply | Qty: 60 | Fill #0

## 2019-07-31 MED FILL — MELOXICAM 7.5 MG TABLET: 7.5 | 30 days supply | Qty: 30 | Fill #0

## 2019-07-31 MED FILL — METFORMIN HCL 500 MG TABS: 500 | 30 days supply | Qty: 120 | Fill #0

## 2019-07-31 MED FILL — glipiZIDE 5 MG TABS: 5 | 30 days supply | Qty: 30 | Fill #0

## 2019-07-31 NOTE — Patient Instructions (Signed)

## 2019-07-31 NOTE — Progress Notes (Signed)
Subjective:  Patient ID: John Martinez, male    DOB: 1960-04-21  Age: 59 y.o. MRN: [id number]  CC: Diabetes   HPI John Martinez  is a 59 year old male with History of Type 2 DM (A1c 7.6), Hypertension last seen in the clinic in 2018 here for a follow up visit.  2 days ago he noticed R wrist swelling after cutting his grass with a push mower and this has improved slightly with Naproxen; one month ago he had a similar occurrence. L knee has been swollen and he can hardly walk. Naproxen is ineffective He has had arthrocentesis of L knee in the past as well as corticosone injections , the last of which was ineffective. Left knee x-ray from 06/2015 revealed joint effusion, osteoarthritis.  With regards to his diabetes mellitus he is compliant with his Metformin.  Denies hypoglycemia or visual concerns. He sometimes has numbness and pins and needles He applied for SSI and would like to inform me of this.  Past Medical History:  Diagnosis Date  . Diabetes mellitus without complication ([place])   . Sleep apnea     Past Surgical History:  Procedure Laterality Date  . ROTATOR CUFF REPAIR      No family history on file.  No Known Allergies  Outpatient Medications Prior to Visit  Medication Sig Dispense Refill  . atorvastatin (LIPITOR) 20 MG tablet Take 1 tablet (20 mg total) by mouth daily. 30 tablet 6  . lisinopril (ZESTRIL) 2.5 MG tablet Take 1 tablet (2.5 mg total) by mouth daily. 30 tablet 6  . meloxicam (MOBIC) 7.5 MG tablet Take 1 tablet (7.5 mg total) by mouth daily. 30 tablet 3  . metFORMIN (GLUCOPHAGE) 500 MG tablet Take 2 tablets (1,000 mg total) by mouth 2 (two) times daily with a meal. 120 tablet 6  . Blood Glucose Monitoring Suppl (TRUE METRIX METER) DEVI 1 each by Does not apply route 3 (three) times daily before meals. (Patient not taking: Reported on 04/29/2019) 1 Device 0  . gabapentin (NEURONTIN) 300 MG capsule Take 1 capsule (300 mg total) by mouth 2 (two) times daily.  (Patient not taking: Reported on 04/29/2019) 60 capsule 6  . glucose blood (TRUE METRIX BLOOD GLUCOSE TEST) test strip Used 3 times daily as directed before meals (Patient not taking: Reported on 04/29/2019) 100 each 12  . neomycin-polymyxin-pramoxine (NEOSPORIN PLUS) 1 % cream Apply 1 application topically 3 (three) times daily as needed (wound card). Reported on 07/29/2015    . Pramoxine-Benzalkonium Cl (FIRST AID ANTISEPTIC) 1-0.13 % LIQD Apply 1 application topically 3 (three) times daily as needed (wound card). Reported on 07/29/2015    . TRUEPLUS LANCETS 28G MISC 1 each by Does not apply route 3 (three) times daily before meals. (Patient not taking: Reported on 03/22/2017) 100 each 12   No facility-administered medications prior to visit.     ROS Review of Systems  Constitutional: Negative for activity change and appetite change.  HENT: Negative for sinus pressure and sore throat.   Eyes: Negative for visual disturbance.  Respiratory: Negative for cough, chest tightness and shortness of breath.   Cardiovascular: Negative for chest pain and leg swelling.  Gastrointestinal: Negative for abdominal distention, abdominal pain, constipation and diarrhea.  Endocrine: Negative.   Genitourinary: Negative for dysuria.  Musculoskeletal:       See HPI  Skin: Negative for rash.  Allergic/Immunologic: Negative.   Neurological: Negative for weakness, light-headedness and numbness.  Psychiatric/Behavioral: Negative for dysphoric mood and  suicidal ideas.    Objective:  BP 113/73   Pulse 70   Ht 6\' 2"  (1.88 m)   Wt 237 lb (107.5 kg)   SpO2 98%   BMI 30.43 kg/m   BP/Weight 07/31/2019 04/29/2019 [id number]  Systolic BP 123456 123456 123456  Diastolic BP 73 76 72  Wt. (Lbs) 237 243 237.4  BMI 30.43 31.2 30.48      Physical Exam Constitutional:      Appearance: He is well-developed.  Neck:     Vascular: No JVD.  Cardiovascular:     Rate and Rhythm: Normal rate.     Heart sounds: Normal heart  sounds. No murmur.  Pulmonary:     Effort: Pulmonary effort is normal.     Breath sounds: Normal breath sounds. No wheezing or rales.  Chest:     Chest wall: No tenderness.  Abdominal:     General: Bowel sounds are normal. There is no distension.     Palpations: Abdomen is soft. There is no mass.     Tenderness: There is no abdominal tenderness.  Musculoskeletal:     Right lower leg: No edema.     Left lower leg: No edema.     Comments: Slight left knee edema with crepitus and tenderness on range of motion Slight right wrist edema, no tenderness on range of motion  Neurological:     Mental Status: He is alert and oriented to person, place, and time.  Psychiatric:        Mood and Affect: Mood normal.     CMP Latest Ref Rng & Units 04/29/2019 03/24/2017 07/16/2015  Glucose 65 - 99 mg/dL 218(H) 140(H) 149(H)  BUN 6 - 24 mg/dL 11 10 8   Creatinine 0.76 - 1.27 mg/dL 1.13 1.16 0.95  Sodium 134 - 144 mmol/L 138 141 142  Potassium 3.5 - 5.2 mmol/L 4.2 4.8 4.6  Chloride 96 - 106 mmol/L 100 103 103  CO2 20 - 29 mmol/L 22 26 28   Calcium 8.7 - 10.2 mg/dL 8.7 8.5(L) 8.8  Total Protein 6.0 - 8.5 g/dL 7.2 6.4 6.4  Total Bilirubin 0.0 - 1.2 mg/dL 0.3 <0.2 0.5  Alkaline Phos 39 - 117 IU/L 92 80 77  AST 0 - 40 IU/L 16 11 11   ALT 0 - 44 IU/L 15 11 10     Lipid Panel     Component Value Date/Time   CHOL 148 03/24/2017 0918   TRIG 95 03/24/2017 0918   HDL 46 03/24/2017 0918   CHOLHDL 3.2 03/24/2017 0918   CHOLHDL 3.5 07/16/2015 0946   VLDL 21 07/16/2015 0946   LDLCALC 83 03/24/2017 0918    CBC    Component Value Date/Time   WBC 8.4 07/16/2015 0946   RBC 5.20 07/16/2015 0946   HGB 15.6 07/16/2015 0946   HCT 45.6 07/16/2015 0946   PLT 463 (H) 07/16/2015 0946   MCV 87.7 07/16/2015 0946   MCH 30.0 07/16/2015 0946   MCHC 34.2 07/16/2015 0946   RDW 14.8 07/16/2015 0946   LYMPHSABS 2.5 07/16/2015 0946   MONOABS 0.9 07/16/2015 0946   EOSABS 0.3 07/16/2015 0946   BASOSABS 0.1 07/16/2015  0946    Lab Results  Component Value Date   HGBA1C 7.6 (A) 07/31/2019    Assessment & Plan:   1. Type 2 diabetes mellitus with other specified complication, without long-term current use of insulin (HCC) Not fully optimized with A1c of 7.6; goal is less than 7 Glipizide added to regimen Counseled on Diabetic diet,  my plate method, [id number] minutes of moderate intensity exercise/week Blood sugar logs with fasting goals of 80-120 mg/dl, random of less than 180 and in the event of sugars less than 60 mg/dl or greater than 400 mg/dl encouraged to notify the clinic. Advised on the need for annual eye exams, annual foot exams, Pneumonia vaccine. - POCT glucose (manual entry) - POCT glycosylated hemoglobin (Hb A1C) - glipiZIDE (GLUCOTROL) 5 MG tablet; Take 0.5 tablets (2.5 mg total) by mouth 2 (two) times daily before a meal.  Dispense: 30 tablet; Refill: 6 - lisinopril (ZESTRIL) 2.5 MG tablet; Take 1 tablet (2.5 mg total) by mouth daily.  Dispense: 30 tablet; Refill: 6 - metFORMIN (GLUCOPHAGE) 500 MG tablet; Take 2 tablets (1,000 mg total) by mouth 2 (two) times daily with a meal.  Dispense: 120 tablet; Refill: 6 - Basic Metabolic Panel  2. Other diabetic neurological complication associated with type 2 diabetes mellitus (HCC) Stable Continue gabapentin - gabapentin (NEURONTIN) 300 MG capsule; Take 1 capsule (300 mg total) by mouth 2 (two) times daily.  Dispense: 60 capsule; Refill: 6  3. Osteoarthritis of left knee, unspecified osteoarthritis type Uncontrolled on conservative treatment Continue meloxicam, use knee brace Status post arthrocentesis in the past We will refer to orthopedics - AMB referral to orthopedics  4. Right wrist pain We will need to exclude gout Continue meloxicam - Uric Acid     Charlott Rakes, MD, FAAFP. [facility] and [place], [place]   07/31/2019, 3:08 PM

## 2019-07-31 NOTE — Progress Notes (Signed)
Swelling in right wrist, states that if flares up at least 2 times a month.

## 2019-08-01 LAB — BASIC METABOLIC PANEL
BUN/Creatinine Ratio: 16 (ref 9–20)
BUN: 18 mg/dL (ref 6–24)
CO2: 22 mmol/L (ref 20–29)
Calcium: 9.9 mg/dL (ref 8.7–10.2)
Chloride: 104 mmol/L (ref 96–106)
Creatinine, Ser: 1.13 mg/dL (ref 0.76–1.27)
GFR calc Af Amer: 84 mL/min/{1.73_m2} (ref >59)
GFR calc non Af Amer: 72 mL/min/{1.73_m2} (ref >59)
Glucose: 135 mg/dL — ABNORMAL HIGH (ref 65–99)
Potassium: 4.4 mmol/L (ref 3.5–5.2)
Sodium: 138 mmol/L (ref 134–144)

## 2019-08-01 LAB — URIC ACID: Uric Acid: 5.6 mg/dL (ref 3.8–8.4)

## 2019-08-02 ENCOUNTER — Telehealth: Payer: Self-pay

## 2019-08-02 NOTE — Telephone Encounter (Signed)
-----   Message from Charlott Rakes, MD sent at 08/01/2019  9:00 AM EDT ----- Blood work is negative for gout.  Other labs are stable

## 2019-08-02 NOTE — Telephone Encounter (Signed)
Patient name and DOB has been verified Patient was informed of lab results. Patient had no questions.  

## 2019-10-06 ENCOUNTER — Other Ambulatory Visit: Payer: Self-pay

## 2019-10-06 ENCOUNTER — Emergency Department (HOSPITAL_COMMUNITY)
Admission: EM | Admit: 2019-10-06 | Discharge: 2019-10-06 | Disposition: A | Discharge status: Home / Self Care | Payer: Self-pay | Attending: Emergency Medicine | Admitting: Emergency Medicine

## 2019-10-06 ENCOUNTER — Encounter (HOSPITAL_COMMUNITY): Payer: Self-pay

## 2019-10-06 DIAGNOSIS — Z7984 Long term (current) use of oral hypoglycemic drugs: Secondary | ICD-10-CM | POA: Insufficient documentation

## 2019-10-06 DIAGNOSIS — E1140 Type 2 diabetes mellitus with diabetic neuropathy, unspecified: Secondary | ICD-10-CM | POA: Insufficient documentation

## 2019-10-06 DIAGNOSIS — F17210 Nicotine dependence, cigarettes, uncomplicated: Secondary | ICD-10-CM | POA: Insufficient documentation

## 2019-10-06 DIAGNOSIS — R21 Rash and other nonspecific skin eruption: Secondary | ICD-10-CM

## 2019-10-06 DIAGNOSIS — Z79899 Other long term (current) drug therapy: Secondary | ICD-10-CM | POA: Insufficient documentation

## 2019-10-06 DIAGNOSIS — L259 Unspecified contact dermatitis, unspecified cause: Secondary | ICD-10-CM | POA: Insufficient documentation

## 2019-10-06 MED ORDER — FAMOTIDINE 20 MG PO TABS
20.0000 mg | ORAL_TABLET | Freq: Once | ORAL | Status: AC
  Administered 2019-10-06: 20 mg via ORAL
  Filled 2019-10-06: qty 1

## 2019-10-06 MED ORDER — PREDNISONE 20 MG PO TABS: 40.0000 mg | ORAL_TABLET | Freq: Every day | ORAL | 0 refills | Status: AC

## 2019-10-06 MED ORDER — FAMOTIDINE 20 MG PO TABS: 20.0000 mg | ORAL_TABLET | Freq: Two times a day (BID) | ORAL | 0 refills | Status: AC

## 2019-10-06 MED ORDER — DIPHENHYDRAMINE HCL 25 MG PO TABS: 25.0000 mg | ORAL_TABLET | Freq: Three times a day (TID) | ORAL | 0 refills | Status: AC | PRN

## 2019-10-06 MED ORDER — PREDNISONE 20 MG PO TABS
60.0000 mg | ORAL_TABLET | Freq: Once | ORAL | Status: AC
  Administered 2019-10-06: 60 mg via ORAL
  Filled 2019-10-06: qty 3

## 2019-10-06 MED ORDER — OXYCODONE-ACETAMINOPHEN 5-325 MG PO TABS
1.0000 | ORAL_TABLET | Freq: Once | ORAL | Status: AC
  Administered 2019-10-06: 1 via ORAL
  Filled 2019-10-06: qty 1

## 2019-10-06 MED ORDER — CEPHALEXIN 500 MG PO CAPS: 500.0000 mg | ORAL_CAPSULE | Freq: Four times a day (QID) | ORAL | 0 refills | Status: AC

## 2019-10-06 MED ORDER — CEPHALEXIN 250 MG PO CAPS
500.0000 mg | ORAL_CAPSULE | Freq: Once | ORAL | Status: AC
  Administered 2019-10-06: 500 mg via ORAL
  Filled 2019-10-06: qty 2

## 2019-10-06 NOTE — Discharge Instructions (Addendum)
Take Benadryl, Pepcid as directed.  As we discussed, we have started you on prednisone.  This is a very short course.  Closely monitor your sugars as this can make your blood sugars rise.  We have done a shorter course to help with that.  Take antibiotics as directed. Please take all of your antibiotics until finished.  Return the emergency department for fever, worsening rash, redness or swelling that begins to spread, difficulty breathing, swelling of your tongue or lips or any other worsening or concerning symptoms.  Follow-up with [facility] to establish a primary care doctor if you do not have one.

## 2019-10-06 NOTE — ED Notes (Signed)
Patient and SO requesting to speak with me. Unhappy that ED Provider and RN got pulled into room of critical patient and there was a delay in order for medications. Unable to reason with patient and SO regarding delay due to another patient having an emergency. SO requested my name and it was provided

## 2019-10-06 NOTE — ED Provider Notes (Signed)
[facility] EMERGENCY DEPARTMENT Provider Note   CSN: [id number] Arrival date & time: 10/06/19  1703     History Chief Complaint  Patient presents with  . Rash    John Martinez is a 59 y.o. male has medical 3 of diabetes, sleep apnea, diabetic neuropathy who presents for evaluation of rash to his neck, face, bilateral upper extremities.  He states that this rash began about 4 days ago.  He does not know of any new exposures, lotions, soaps.  He does state that prior to onset of rash, he walked through the door where some insulation was exposed.  He states that this did touch him.  He states that he only has a rash to the area that was exposed and without clothing.  The rest of his area that was covered is without any rash.  He states that over the last few days, the rash has gotten progressively more pruritic.  He states that on his left upper extremity, he particularly has an area that is very pruritic, red and had been draining.  He said he had not noted any fever.  He has not had any swelling of his tongue or lips and has not noted any difficulty breathing or vomiting.  Patient states that he has never had history of allergies.  He is not started on any new medications.  He denies any new or recent medications.  The history is provided by the patient.       Past Medical History:  Diagnosis Date  . Diabetes mellitus without complication ([place])   . Sleep apnea     Patient Active Problem List   Diagnosis Date Noted  . Diabetic neuropathy ([place]) 03/22/2017  . Tobacco use disorder 07/29/2015  . Diabetes mellitus ([place]) 07/16/2015  . Osteoarthritis of left knee 07/16/2015    Past Surgical History:  Procedure Laterality Date  . ROTATOR CUFF REPAIR         No family history on file.  Social History   Tobacco Use  . Smoking status: Current Every Day Smoker    Packs/day: 0.25    Types: Cigarettes  . Smokeless tobacco: Never Used  Substance Use Topics  .  Alcohol use: No  . Drug use: No    Home Medications Prior to Admission medications   Medication Sig Start Date End Date Taking? Authorizing Provider  atorvastatin (LIPITOR) 20 MG tablet Take 1 tablet (20 mg total) by mouth daily. 04/29/19   Charlott Rakes, MD  Blood Glucose Monitoring Suppl (TRUE METRIX METER) DEVI 1 each by Does not apply route 3 (three) times daily before meals. Patient not taking: Reported on 04/29/2019 07/29/15   Charlott Rakes, MD  cephALEXin (KEFLEX) 500 MG capsule Take 1 capsule (500 mg total) by mouth 4 (four) times daily for 7 days. 10/06/19 10/13/19  Volanda Napoleon, PA-C  diphenhydrAMINE (BENADRYL) 25 MG tablet Take 1 tablet (25 mg total) by mouth every 8 (eight) hours as needed. 10/06/19   Volanda Napoleon, PA-C  famotidine (PEPCID) 20 MG tablet Take 1 tablet (20 mg total) by mouth 2 (two) times daily. 10/06/19   Volanda Napoleon, PA-C  gabapentin (NEURONTIN) 300 MG capsule Take 1 capsule (300 mg total) by mouth 2 (two) times daily. 07/31/19   Charlott Rakes, MD  glipiZIDE (GLUCOTROL) 5 MG tablet Take 0.5 tablets (2.5 mg total) by mouth 2 (two) times daily before a meal. 07/31/19   Charlott Rakes, MD  glucose blood (TRUE METRIX BLOOD  GLUCOSE TEST) test strip Used 3 times daily as directed before meals Patient not taking: Reported on 04/29/2019 03/14/17   Charlott Rakes, MD  lisinopril (ZESTRIL) 2.5 MG tablet Take 1 tablet (2.5 mg total) by mouth daily. 07/31/19   Charlott Rakes, MD  meloxicam (MOBIC) 7.5 MG tablet Take 1 tablet (7.5 mg total) by mouth daily. 07/31/19   Charlott Rakes, MD  metFORMIN (GLUCOPHAGE) 500 MG tablet Take 2 tablets (1,000 mg total) by mouth 2 (two) times daily with a meal. 07/31/19   Charlott Rakes, MD  neomycin-polymyxin-pramoxine (NEOSPORIN PLUS) 1 % cream Apply 1 application topically 3 (three) times daily as needed (wound card). Reported on 07/29/2015    [provider]  Pramoxine-Benzalkonium Cl (FIRST AID ANTISEPTIC) 1-0.13 %  LIQD Apply 1 application topically 3 (three) times daily as needed (wound card). Reported on 07/29/2015    [provider]  predniSONE (DELTASONE) 20 MG tablet Take 2 tablets (40 mg total) by mouth daily for 4 days. 10/06/19 10/10/19  Volanda Napoleon, PA-C  TRUEPLUS LANCETS 28G MISC 1 each by Does not apply route 3 (three) times daily before meals. Patient not taking: Reported on 03/22/2017 07/29/15   Charlott Rakes, MD    Allergies    Patient has no known allergies.  Review of Systems   Review of Systems  Constitutional: Negative for fever.  HENT: Negative for facial swelling.   Respiratory: Negative for shortness of breath.   Gastrointestinal: Negative for nausea and vomiting.  Skin: Positive for color change and rash.  All other systems reviewed and are negative.   Physical Exam Updated Vital Signs BP (!) 154/95 (BP Location: Right Arm)   Pulse 66   Temp 98 F (36.7 C) (Oral)   Resp 16   Ht 6' (1.829 m)   Wt 111.6 kg   SpO2 99%   BMI 33.36 kg/m   Physical Exam Vitals and nursing note reviewed.  Constitutional:      Appearance: He is well-developed.  HENT:     Head: Normocephalic and atraumatic.     Comments: Face is symmetric in appearance without any overlying warmth, erythema.    Mouth/Throat:     Comments: No oral angioedema.  No oral lesions. Eyes:     General: No scleral icterus.       Right eye: No discharge.        Left eye: No discharge.     Conjunctiva/sclera: Conjunctivae normal.  Pulmonary:     Effort: Pulmonary effort is normal.     Comments: Lungs clear to auscultation bilaterally.  Symmetric chest rise.  No wheezing, rales, rhonchi. Skin:    General: Skin is warm and dry.     Comments: Diffuse maculopapular rash noted to his face, the left side of his neck.  He has diffuse back with papular rash on bilateral upper extremities.  On his left upper extremity, there is an area on his proximal forearm that is dry, scaly.  There is some slight  warmth and erythema.  No active drainage.  No rash noted to palms.   Neurological:     Mental Status: He is alert.  Psychiatric:        Speech: Speech normal.        Behavior: Behavior normal.     ED Results / Procedures / Treatments   Labs (all labs ordered are listed, but only abnormal results are displayed) Labs Reviewed - No data to display  EKG None  Radiology No results found.  Procedures Procedures (including critical care time)  Medications Ordered in ED Medications  cephALEXin (KEFLEX) capsule 500 mg (500 mg Oral Given 10/06/19 2004)  famotidine (PEPCID) tablet 20 mg (20 mg Oral Given 10/06/19 2005)  predniSONE (DELTASONE) tablet 60 mg (60 mg Oral Given 10/06/19 2005)  oxyCODONE-acetaminophen (PERCOCET/ROXICET) 5-325 MG per tablet 1 tablet (1 tablet Oral Given 10/06/19 2005)    ED Course  I have reviewed the triage vital signs and the nursing notes.  Pertinent labs & imaging results that were available during my care of the patient were reviewed by me and considered in my medical decision making (see chart for details).    MDM Rules/Calculators/A&P                          59 year old male who presents for evaluation of rash x4 days.  He reports that he started it breaking out in a rash to his neck, arms about 4 days ago.  He reports he walked through house and had some insulation fall on him.  Since then, he has had paretic rash noted to his neck, his arms.  He states particular, his arm is gotten worse.  He states that it had been draining slightly.  No fevers, oral edema, free swelling.  On initially arrival, he is afebrile, nontoxic-appearing.  Vital signs are stable.  On exam, he has diffuse maculopapular rash noted to face, neck and bilateral arms.  Left upper extremity does have some overlying warmth and erythema.  Is particular dry and scaly.  No active drainage.  Suspect this is contact dermatitis.  He may have a superimposed imposed infection on his left upper  extremity.  History/physical exam not concerning for SJS, TENS, shingles, herpes.  We will plan to treat with Benadryl, Pepcid.  Additionally, given short course of prednisone.  I discussed with him that he should closely monitor his sugars while he is on this medicine medication.  Additionally, given concern for superimposed infection, will plan to start him on Keflex.  Patient with no known drug allergies. At this time, patient exhibits no emergent life-threatening condition that require further evaluation in ED or admission. Patient had ample opportunity for questions and discussion. All patient's questions were answered with full understanding. Strict return precautions discussed. Patient expresses understanding and agreement to plan.   Portions of this note were generated with [employer]. Dictation errors may occur despite best attempts at proofreading.  Final Clinical Impression(s) / ED Diagnoses Final diagnoses:  Contact dermatitis, unspecified contact dermatitis type, unspecified trigger  Rash    Rx / DC Orders ED Discharge Orders         Ordered    cephALEXin (KEFLEX) 500 MG capsule  4 times daily     Discontinue  Reprint     10/06/19 1949    predniSONE (DELTASONE) 20 MG tablet  Daily     Discontinue  Reprint     10/06/19 1949    famotidine (PEPCID) 20 MG tablet  2 times daily     Discontinue  Reprint     10/06/19 1949    diphenhydrAMINE (BENADRYL) 25 MG tablet  Every 8 hours PRN     Discontinue  Reprint     10/06/19 1949           Volanda Napoleon, PA-C 10/06/19 2224    Hayden Rasmussen, MD 10/07/19 1154

## 2019-10-06 NOTE — ED Notes (Signed)
Pt verbalized understanding of d/c instructions, follow up care and s/s requiring return to ed. Pt had no additional questions at this time and was transported to exit via wheelchair.

## 2019-10-06 NOTE — ED Triage Notes (Signed)
Pt presents with a rash from his face down to his neck and BUE. Pt reports he had old insulation fall on him Thursday and his rash developed soon after.

## 2019-10-07 MED FILL — CEPHALEXIN 500 MG CAPSULE: 500 | 7 days supply | Qty: 28 | Fill #0

## 2019-10-07 MED FILL — predniSONE 20 MG TABS: 20 | 4 days supply | Qty: 8 | Fill #0

## 2019-10-07 MED FILL — FAMOTIDINE 20 MG TABS: 20 | 5 days supply | Qty: 10 | Fill #0

## 2019-11-04 ENCOUNTER — Encounter: Payer: Self-pay | Admitting: Family Medicine

## 2019-11-04 ENCOUNTER — Ambulatory Visit: Payer: Self-pay | Attending: Family Medicine | Admitting: Family Medicine

## 2019-11-04 ENCOUNTER — Other Ambulatory Visit: Payer: Self-pay

## 2019-11-04 VITALS — BP 114/69 | HR 77 | Ht 72.0 in | Wt 231.2 lb

## 2019-11-04 DIAGNOSIS — L568 Other specified acute skin changes due to ultraviolet radiation: Secondary | ICD-10-CM

## 2019-11-04 DIAGNOSIS — E1169 Type 2 diabetes mellitus with other specified complication: Secondary | ICD-10-CM

## 2019-11-04 LAB — POCT GLYCOSYLATED HEMOGLOBIN (HGB A1C): HbA1c, POC (controlled diabetic range): 7.6 % — AB (ref 0.0–7.0)

## 2019-11-04 LAB — GLUCOSE, POCT (MANUAL RESULT ENTRY): POC Glucose: 172 mg/dl — AB (ref 70–99)

## 2019-11-04 MED ORDER — TRIAMCINOLONE ACETONIDE 0.1 % EX CREA: 1.0000 "application " | TOPICAL_CREAM | Freq: Two times a day (BID) | CUTANEOUS | 1 refills | Status: DC

## 2019-11-04 MED ORDER — PREDNISONE 20 MG PO TABS: 20.0000 mg | ORAL_TABLET | Freq: Every day | ORAL | 0 refills | Status: DC

## 2019-11-04 MED FILL — TRIAMCINOLONE ACETONIDE 0.1: 0.1 | 15 days supply | Qty: 80 | Fill #0

## 2019-11-04 MED FILL — predniSONE 20 MG TABS: 20 | 5 days supply | Qty: 5 | Fill #0

## 2019-11-04 NOTE — Patient Instructions (Signed)
Polymorphous Light Eruption Polymorphous light eruption is a skin reaction to rays of energy that are found in sunlight (ultraviolet radiation, or UV radiation). Tanning beds and sunlamps also use UV radiation. Polymorphous light eruption can cause itchy, red patches to develop on skin that is exposed to UV radiation. The patches usually appear immediately after or within a few hours of being in the sun or after using a tanning bed or sunlamp. They often go away on their own within several days or weeks. What are the causes? The cause of this condition is not known. What increases the risk? You are more likely to develop this condition if:  You are a woman.  You have light-colored skin (light complexion).  You live in a northern climate.  You have a family history of this condition. What are the signs or symptoms? Symptoms of this condition include:  Small bumps on the skin.  Itchy patches on the skin.  Redness or scales on the skin.  A feeling of burning on the skin.  Swelling. This is rare.  Blisters. These are rare.  Chills, headache, nausea, and a general feeling of illness (malaise). These symptoms are rare. How is this diagnosed? This condition may be diagnosed based on:  A physical exam.  Your medical history.  Phototests. These are tests to check your skin's reaction to a type of ultraviolet light (UVA light). How is this treated? Polymorphous light eruption may clear up without treatment. Treatment for this condition may include:  Protecting your skin from the sun with clothing and sunscreen.  Avoiding sunlight between the hours of 11 a.m. and 2 p.m.  Applying medicines to the skin to reduce swelling (topical corticosteroids).  Gradually spending more time in the sun to build resistance.  Oral immunosuppressive therapy, in rare cases. These are medicines taken by mouth that reduce the activity of the body's disease-fighting (immune) system. Follow these  instructions at home:   Take and apply over-the-counter and prescription medicines only as told by your health care provider.  Wear clothing to protect your skin from sunlight. This includes long sleeves and hats.  Use broad-spectrum sunscreens that have an SPF of 30 or higher. Broad-spectrum means that the sunscreen protects your skin from both types of UV light (UVA and UVB). Check product labels to make sure that the sunscreen protects against UVA and UVB light.  Follow instructions from your health care provider about being in the sun. You may need to: ? Avoid spending time in the sun between the hours of 11 a.m. and 2 p.m. UV radiation is strongest during those hours. ? Gradually spend more and more time in sunlight.  Keep all follow-up visits as told by your health care provider. This is important. Contact a health care provider if:  You have a fever.  You have severe pain, and medicines do not help. Get help right away if:  You have severe skin blistering, peeling, or bleeding.  You have a very high fever or chills.  You have a severe headache, you feel confused, or you faint.  You have nausea or vomiting.  You develop body aches and pains. Summary  Polymorphous light eruption is a skin reaction to rays of energy found in sunlight (UV radiation). This condition can also be caused by UV radiation from tanning beds and sun lamps.  You may have itchy, red patches on your skin that go away on their own.  You may not need treatment. If you do need treatment,   it usually includes protective clothing to shield your skin from the sun, broad-spectrum sunscreens with an SPF of 30 or higher, and medicines that you apply to your skin. This information is not intended to replace advice given to you by your health care provider. Make sure you discuss any questions you have with your health care provider. Document Revised: 07/27/2018 Document Reviewed: 07/01/2016 Elsevier Patient  Education  2020 Elsevier Inc.  

## 2019-11-04 NOTE — Progress Notes (Signed)
Subjective:  Patient ID: John Martinez, male    DOB: [birth year]  Age: 59 y.o. MRN: [id number]  CC: Diabetes   HPI John Martinez is a 59 year old male with History of Type 2 DM (A1c 7.6), Hypertension last seen in the clinic in 2018 here for a follow up visit. He has had itching in b/l upper extremities from his lower forearm down to his hands and from his neck up to his face and area spared is in the pattern of a short sleeve shirt. It started after he was riding in the car with someone and justt got out of the car a couple of time.s. Denies exposre to plants or working in the yard. He has also had purulent lesions as well but none at this time.  Seen at the ED last month and placed on Keflex, Prednisone.  He is unable to sleep; symptoms worsen when he goes outside especially when it is humid. He has been out of Glipizide due to financial struggles. Past Medical History:  Diagnosis Date  . Diabetes mellitus without complication ([place])   . Sleep apnea     Past Surgical History:  Procedure Laterality Date  . ROTATOR CUFF REPAIR      History reviewed. No pertinent family history.  No Known Allergies  Outpatient Medications Prior to Visit  Medication Sig Dispense Refill  . atorvastatin (LIPITOR) 20 MG tablet Take 1 tablet (20 mg total) by mouth daily. 30 tablet 6  . Blood Glucose Monitoring Suppl (TRUE METRIX METER) DEVI 1 each by Does not apply route 3 (three) times daily before meals. 1 Device 0  . diphenhydrAMINE (BENADRYL) 25 MG tablet Take 1 tablet (25 mg total) by mouth every 8 (eight) hours as needed. 14 tablet 0  . famotidine (PEPCID) 20 MG tablet Take 1 tablet (20 mg total) by mouth 2 (two) times daily. 10 tablet 0  . gabapentin (NEURONTIN) 300 MG capsule Take 1 capsule (300 mg total) by mouth 2 (two) times daily. 60 capsule 6  . glipiZIDE (GLUCOTROL) 5 MG tablet Take 0.5 tablets (2.5 mg total) by mouth 2 (two) times daily before a meal. 30 tablet 6  . glucose blood (TRUE  METRIX BLOOD GLUCOSE TEST) test strip Used 3 times daily as directed before meals 100 each 12  . lisinopril (ZESTRIL) 2.5 MG tablet Take 1 tablet (2.5 mg total) by mouth daily. 30 tablet 6  . meloxicam (MOBIC) 7.5 MG tablet Take 1 tablet (7.5 mg total) by mouth daily. 30 tablet 6  . metFORMIN (GLUCOPHAGE) 500 MG tablet Take 2 tablets (1,000 mg total) by mouth 2 (two) times daily with a meal. 120 tablet 6  . neomycin-polymyxin-pramoxine (NEOSPORIN PLUS) 1 % cream Apply 1 application topically 3 (three) times daily as needed (wound card). Reported on 07/29/2015    . Pramoxine-Benzalkonium Cl (FIRST AID ANTISEPTIC) 1-0.13 % LIQD Apply 1 application topically 3 (three) times daily as needed (wound card). Reported on 07/29/2015    . TRUEPLUS LANCETS 28G MISC 1 each by Does not apply route 3 (three) times daily before meals. 100 each 12   No facility-administered medications prior to visit.     ROS Review of Systems  Constitutional: Negative for activity change and appetite change.  HENT: Negative for sinus pressure and sore throat.   Eyes: Negative for visual disturbance.  Respiratory: Negative for cough, chest tightness and shortness of breath.   Cardiovascular: Negative for chest pain and leg swelling.  Gastrointestinal: Negative for  abdominal distention, abdominal pain, constipation and diarrhea.  Endocrine: Negative.   Genitourinary: Negative for dysuria.  Musculoskeletal: Negative for joint swelling and myalgias.  Skin: Positive for rash.  Allergic/Immunologic: Negative.   Neurological: Negative for weakness, light-headedness and numbness.  Psychiatric/Behavioral: Negative for dysphoric mood and suicidal ideas.    Objective:  BP 114/69   Pulse 77   Ht 6' (1.829 m)   Wt 231 lb 3.2 oz (104.9 kg)   SpO2 98%   BMI 31.36 kg/m   BP/Weight 11/04/2019 10/06/2019 1/44/3154  Systolic BP 008 676 195  Diastolic BP 69 95 73  Wt. (Lbs) 231.2 246 237  BMI 31.36 33.36 30.43      Physical  Exam Constitutional:      Appearance: He is well-developed.  Neck:     Vascular: No JVD.  Cardiovascular:     Rate and Rhythm: Normal rate.     Heart sounds: Normal heart sounds. No murmur heard.   Pulmonary:     Effort: Pulmonary effort is normal.     Breath sounds: Normal breath sounds. No wheezing or rales.  Chest:     Chest wall: No tenderness.  Abdominal:     General: Bowel sounds are normal. There is no distension.     Palpations: Abdomen is soft. There is no mass.     Tenderness: There is no abdominal tenderness.  Musculoskeletal:        General: Normal range of motion.     Right lower leg: No edema.     Left lower leg: No edema.  Skin:    Comments: Hypopigmented rash on bilateral upper extremities extending from lower third of on down to hands on dorsum aspect. Hyperpigmented rash on entire neck which stops short of base of the neck.  No purulent lesions noted  Neurological:     Mental Status: He is alert and oriented to person, place, and time.  Psychiatric:        Mood and Affect: Mood normal.     CMP Latest Ref Rng & Units 07/31/2019 04/29/2019 03/24/2017  Glucose 65 - 99 mg/dL 135(H) 218(H) 140(H)  BUN 6 - 24 mg/dL 18 11 10   Creatinine 0.76 - 1.27 mg/dL 1.13 1.13 1.16  Sodium 134 - 144 mmol/L 138 138 141  Potassium 3.5 - 5.2 mmol/L 4.4 4.2 4.8  Chloride 96 - 106 mmol/L 104 100 103  CO2 20 - 29 mmol/L 22 22 26   Calcium 8.7 - 10.2 mg/dL 9.9 8.7 8.5(L)  Total Protein 6.0 - 8.5 g/dL - 7.2 6.4  Total Bilirubin 0.0 - 1.2 mg/dL - 0.3 <0.2  Alkaline Phos 39 - 117 IU/L - 92 80  AST 0 - 40 IU/L - 16 11  ALT 0 - 44 IU/L - 15 11    Lipid Panel     Component Value Date/Time   CHOL 148 03/24/2017 0918   TRIG 95 03/24/2017 0918   HDL 46 03/24/2017 0918   CHOLHDL 3.2 03/24/2017 0918   CHOLHDL 3.5 07/16/2015 0946   VLDL 21 07/16/2015 0946   LDLCALC 83 03/24/2017 0918    CBC    Component Value Date/Time   WBC 8.4 07/16/2015 0946   RBC 5.20 07/16/2015 0946   HGB  15.6 07/16/2015 0946   HCT 45.6 07/16/2015 0946   PLT 463 (H) 07/16/2015 0946   MCV 87.7 07/16/2015 0946   MCH 30.0 07/16/2015 0946   MCHC 34.2 07/16/2015 0946   RDW 14.8 07/16/2015 0946   LYMPHSABS 2.5 07/16/2015  0946   MONOABS 0.9 07/16/2015 0946   EOSABS 0.3 07/16/2015 0946   BASOSABS 0.1 07/16/2015 0946    Lab Results  Component Value Date   HGBA1C 7.6 (A) 11/04/2019    Assessment & Plan:  1. Type 2 diabetes mellitus with other specified complication, without long-term current use of insulin (HCC) Not at goal with A1 of 7.6 which is >goal of <7.0 He has been out of Glipizide hence no regimen changes will be made today Counseled on Diabetic diet, my plate method, 163 minutes of moderate intensity exercise/week Blood sugar logs with fasting goals of 80-120 mg/dl, random of less than 180 and in the event of sugars less than 60 mg/dl or greater than 400 mg/dl encouraged to notify the clinic. Advised on the need for annual eye exams, annual foot exams, Pneumonia vaccine. - POCT glucose (manual entry) - POCT glycosylated hemoglobin (Hb A1C)  2. Photodermatitis Avoid sun exposure as much as possible No antibiotics indicated at this time Advised to apply for the [facility] financial discount for referral to Dermatology - predniSONE (DELTASONE) 20 MG tablet; Take 1 tablet (20 mg total) by mouth daily with breakfast.  Dispense: 5 tablet; Refill: 0 - triamcinolone cream (KENALOG) 0.1 %; Apply 1 application topically 2 (two) times daily.  Dispense: 80 g; Refill: 1    Meds ordered this encounter  Medications  . predniSONE (DELTASONE) 20 MG tablet    Sig: Take 1 tablet (20 mg total) by mouth daily with breakfast.    Dispense:  5 tablet    Refill:  0  . triamcinolone cream (KENALOG) 0.1 %    Sig: Apply 1 application topically 2 (two) times daily.    Dispense:  80 g    Refill:  1    Follow-up: Return in about 3 months (around 02/04/2020) for Chronic disease management.        Charlott Rakes, MD, FAAFP. [facility] [place], [place]   11/05/2019, 6:26 AM

## 2019-11-04 NOTE — Progress Notes (Signed)
HAS RASH ON HIS ARMS AND NECK THAT ITCHES VERY BAD.

## 2020-01-21 ENCOUNTER — Other Ambulatory Visit: Payer: Self-pay | Admitting: Family Medicine

## 2020-01-21 DIAGNOSIS — E1169 Type 2 diabetes mellitus with other specified complication: Secondary | ICD-10-CM

## 2020-01-21 MED ORDER — METFORMIN HCL 500 MG PO TABS: 1000.0000 mg | ORAL_TABLET | Freq: Two times a day (BID) | ORAL | 2 refills | Status: DC

## 2020-01-21 MED FILL — METFORMIN HCL 500 MG TABS: 500 | 30 days supply | Qty: 120 | Fill #0

## 2020-01-21 NOTE — Telephone Encounter (Signed)
Copied from [place] [id number]. Topic: Quick Communication - Rx Refill/Question >> Jan 21, 2020 12:35 PM Mcneil, Ja-Kwan wrote: Medication: metFORMIN (GLUCOPHAGE) 500 MG tablet  Has the patient contacted their pharmacy? no  Preferred Pharmacy (with phone number or street name): [facility] [place] [address] Phone: [phone]  Fax: [phone]  Agent: Please be advised that RX refills may take up to 3 business days. We ask that you follow-up with your pharmacy.

## 2020-02-04 ENCOUNTER — Other Ambulatory Visit: Payer: Self-pay

## 2020-02-04 ENCOUNTER — Ambulatory Visit: Payer: Self-pay | Attending: Family Medicine | Admitting: Family Medicine

## 2020-02-04 ENCOUNTER — Encounter: Payer: Self-pay | Admitting: Family Medicine

## 2020-02-04 VITALS — BP 137/89 | HR 66 | Ht 72.0 in | Wt 235.6 lb

## 2020-02-04 DIAGNOSIS — M25562 Pain in left knee: Secondary | ICD-10-CM

## 2020-02-04 DIAGNOSIS — M654 Radial styloid tenosynovitis [de Quervain]: Secondary | ICD-10-CM

## 2020-02-04 DIAGNOSIS — G8929 Other chronic pain: Secondary | ICD-10-CM

## 2020-02-04 DIAGNOSIS — M546 Pain in thoracic spine: Secondary | ICD-10-CM

## 2020-02-04 DIAGNOSIS — E1169 Type 2 diabetes mellitus with other specified complication: Secondary | ICD-10-CM

## 2020-02-04 DIAGNOSIS — E1149 Type 2 diabetes mellitus with other diabetic neurological complication: Secondary | ICD-10-CM

## 2020-02-04 DIAGNOSIS — L568 Other specified acute skin changes due to ultraviolet radiation: Secondary | ICD-10-CM

## 2020-02-04 LAB — POCT GLYCOSYLATED HEMOGLOBIN (HGB A1C): HbA1c, POC (controlled diabetic range): 7.6 % — AB (ref 0.0–7.0)

## 2020-02-04 LAB — GLUCOSE, POCT (MANUAL RESULT ENTRY): POC Glucose: 165 mg/dl — AB (ref 70–99)

## 2020-02-04 MED ORDER — MELOXICAM 7.5 MG PO TABS: 7.5000 mg | ORAL_TABLET | Freq: Every day | ORAL | 6 refills | Status: DC

## 2020-02-04 MED ORDER — GLIPIZIDE 5 MG PO TABS: 2.5000 mg | ORAL_TABLET | Freq: Two times a day (BID) | ORAL | 6 refills | Status: DC

## 2020-02-04 MED ORDER — PREDNISONE 20 MG PO TABS: 20.0000 mg | ORAL_TABLET | Freq: Every day | ORAL | 0 refills | Status: DC

## 2020-02-04 MED ORDER — ATORVASTATIN CALCIUM 20 MG PO TABS: 20.0000 mg | ORAL_TABLET | Freq: Every day | ORAL | 6 refills | Status: DC

## 2020-02-04 MED ORDER — GABAPENTIN 300 MG PO CAPS: 300.0000 mg | ORAL_CAPSULE | Freq: Two times a day (BID) | ORAL | 6 refills | Status: DC

## 2020-02-04 MED ORDER — METFORMIN HCL 500 MG PO TABS: 1000.0000 mg | ORAL_TABLET | Freq: Two times a day (BID) | ORAL | 6 refills | Status: DC

## 2020-02-04 MED FILL — predniSONE 20 MG TABS: 20 | 5 days supply | Qty: 5 | Fill #0

## 2020-02-04 MED FILL — glipiZIDE 5 MG TABS: 5 | 30 days supply | Qty: 30 | Fill #0

## 2020-02-04 MED FILL — METFORMIN HCL 500 MG TABS: 500 | 30 days supply | Qty: 120 | Fill #0

## 2020-02-04 MED FILL — ATORVASTATIN CALCIUM 20 MG: 20 | 30 days supply | Qty: 30 | Fill #0

## 2020-02-04 MED FILL — MELOXICAM 7.5 MG TABLET: 7.5 | 30 days supply | Qty: 30 | Fill #0

## 2020-02-04 MED FILL — GABAPENTIN 300 MG CAPSULE: 300 | 30 days supply | Qty: 60 | Fill #0

## 2020-02-04 NOTE — Patient Instructions (Signed)
De Quervain's Tenosynovitis  De Quervain's tenosynovitis is a condition that causes inflammation of the tendon on the thumb side of the wrist. Tendons are cords of tissue that connect bones to muscles. The tendons in the hand pass through a tunnel called a sheath. A slippery layer of tissue (synovium) lets the tendons move smoothly in the sheath. With de Quervain's tenosynovitis, the sheath swells or thickens, causing friction and pain. The condition is also called de Quervain's disease and de Quervain's syndrome. It occurs most often in women who are 30-50 years old. What are the causes? The exact cause of this condition is not known. It may be associated with overuse of the hand and wrist. What increases the risk? You are more likely to develop this condition if you:  Use your hands far more than normal, especially if you repeat certain movements that involve twisting your hand or using a tight grip.  Are pregnant.  Are a middle-aged woman.  Have rheumatoid arthritis.  Have diabetes. What are the signs or symptoms? The main symptom of this condition is pain on the thumb side of the wrist. The pain may get worse when you grasp something or turn your wrist. Other symptoms may include:  Pain that extends up the forearm.  Swelling of your wrist and hand.  Trouble moving the thumb and wrist.  A sensation of snapping in the wrist.  A bump filled with fluid (cyst) in the area of the pain. How is this diagnosed? This condition may be diagnosed based on:  Your symptoms and medical history.  A physical exam. During the exam, your health care provider may do a simple test (Finkelstein test) that involves pulling your thumb and wrist to see if this causes pain. You may also need to have an X-ray. How is this treated? Treatment for this condition may include:  Avoiding any activity that causes pain and swelling.  Taking medicines. Anti-inflammatory medicines and corticosteroid  injections may be used to reduce inflammation and relieve pain.  Wearing a splint.  Having surgery. This may be needed if other treatments do not work. Once the pain and swelling has gone down:  Physical therapy. This includes stretching and strengthening exercises.  Occupational therapy. This includes adjusting how you move your wrist. Follow these instructions at home: If you have a splint:  Wear the splint as told by your health care provider. Remove it only as told by your health care provider.  Loosen the splint if your fingers tingle, become numb, or turn cold and blue.  Keep the splint clean.  If the splint is not waterproof: ? Do not let it get wet. ? Cover it with a watertight covering when you take a bath or a shower. Managing pain, stiffness, and swelling   Avoid movements and activities that cause pain and swelling in the wrist area.  If directed, put ice on the painful area. This may be helpful after doing activities that involve the sore wrist. ? Put ice in a plastic bag. ? Place a towel between your skin and the bag. ? Leave the ice on for 20 minutes, 2-3 times a day.  Move your fingers often to avoid stiffness and to lessen swelling.  Raise (elevate) the injured area above the level of your heart while you are sitting or lying down. General instructions  Return to your normal activities as told by your health care provider. Ask your health care provider what activities are safe for you.  Take over-the-counter   and prescription medicines only as told by your health care provider.  Keep all follow-up visits as told by your health care provider. This is important. Contact a health care provider if:  Your pain medicine does not help.  Your pain gets worse.  You develop new symptoms. Summary  De Quervain's tenosynovitis is a condition that causes inflammation of the tendon on the thumb side of the wrist.  The condition occurs most often in women who are  30-50 years old.  The exact cause of this condition is not known. It may be associated with overuse of the hand and wrist.  Treatment starts with avoiding activity that causes pain or swelling in the wrist area. Other treatment may include wearing a splint and taking medicine. Sometimes, surgery is needed. This information is not intended to replace advice given to you by your health care provider. Make sure you discuss any questions you have with your health care provider. Document Revised: 10/05/2017 Document Reviewed: 03/13/2017 Elsevier Patient Education  2020 [employer]  

## 2020-02-04 NOTE — Progress Notes (Signed)
Having pain in right wrist and knees.  Having pain in upper left side of back.  Rash on neck burns when sunlight hit it.

## 2020-02-04 NOTE — Progress Notes (Signed)
Subjective:  Patient ID: John Martinez, male    DOB: 10-26-1960  Age: 59 y.o. MRN: [id number]  CC: Diabetes   HPI John Martinez is a 59 year old male with History of Type 2 DM (A1c 7.6), Hypertension  Uses Triamcinolone which reduces the itching and dries up the rash. The sun, sweating and heat worsens his symptoms and symptoms are more on the left.  He has Arthritis in his knees which causes pain; L>R. Has had L knee injections. L knee gives out on him sometimes. He has R carpal tunnel with shooting pains from the wrist to his thumb. He is R handed.  Wearing a right wrist brace provides relief  Has not been taking Glipizide but just Metformin for his diabetes and we had discussed this at his last visit and glipizide sent to his pharmacy which he never picked up.  Denies numbness in extremities, visual concerns of hypoglycemia. Also has left upper back pain at the site of the mass which is chronic. Past Medical History:  Diagnosis Date  . Diabetes mellitus without complication ([place])   . Sleep apnea     Past Surgical History:  Procedure Laterality Date  . ROTATOR CUFF REPAIR      No family history on file.  No Known Allergies  Outpatient Medications Prior to Visit  Medication Sig Dispense Refill  . Blood Glucose Monitoring Suppl (TRUE METRIX METER) DEVI 1 each by Does not apply route 3 (three) times daily before meals. 1 Device 0  . diphenhydrAMINE (BENADRYL) 25 MG tablet Take 1 tablet (25 mg total) by mouth every 8 (eight) hours as needed. 14 tablet 0  . famotidine (PEPCID) 20 MG tablet Take 1 tablet (20 mg total) by mouth 2 (two) times daily. 10 tablet 0  . glucose blood (TRUE METRIX BLOOD GLUCOSE TEST) test strip Used 3 times daily as directed before meals 100 each 12  . lisinopril (ZESTRIL) 2.5 MG tablet Take 1 tablet (2.5 mg total) by mouth daily. 30 tablet 6  . neomycin-polymyxin-pramoxine (NEOSPORIN PLUS) 1 % cream Apply 1 application topically 3 (three) times daily as  needed (wound card). Reported on 07/29/2015    . Pramoxine-Benzalkonium Cl (FIRST AID ANTISEPTIC) 1-0.13 % LIQD Apply 1 application topically 3 (three) times daily as needed (wound card). Reported on 07/29/2015    . triamcinolone cream (KENALOG) 0.1 % Apply 1 application topically 2 (two) times daily. 80 g 1  . TRUEPLUS LANCETS 28G MISC 1 each by Does not apply route 3 (three) times daily before meals. 100 each 12  . atorvastatin (LIPITOR) 20 MG tablet Take 1 tablet (20 mg total) by mouth daily. 30 tablet 6  . gabapentin (NEURONTIN) 300 MG capsule Take 1 capsule (300 mg total) by mouth 2 (two) times daily. 60 capsule 6  . glipiZIDE (GLUCOTROL) 5 MG tablet Take 0.5 tablets (2.5 mg total) by mouth 2 (two) times daily before a meal. 30 tablet 6  . meloxicam (MOBIC) 7.5 MG tablet Take 1 tablet (7.5 mg total) by mouth daily. 30 tablet 6  . metFORMIN (GLUCOPHAGE) 500 MG tablet Take 2 tablets (1,000 mg total) by mouth 2 (two) times daily with a meal. 120 tablet 2  . predniSONE (DELTASONE) 20 MG tablet Take 1 tablet (20 mg total) by mouth daily with breakfast. (Patient not taking: Reported on 02/04/2020) 5 tablet 0   No facility-administered medications prior to visit.     ROS Review of Systems  Constitutional: Negative for activity change  and appetite change.  HENT: Negative for sinus pressure and sore throat.   Eyes: Negative for visual disturbance.  Respiratory: Negative for cough, chest tightness and shortness of breath.   Cardiovascular: Negative for chest pain and leg swelling.  Gastrointestinal: Negative for abdominal distention, abdominal pain, constipation and diarrhea.  Endocrine: Negative.   Genitourinary: Negative for dysuria.  Musculoskeletal:       See HPI  Skin: Negative for rash.  Allergic/Immunologic: Negative.   Neurological: Negative for weakness, light-headedness and numbness.  Psychiatric/Behavioral: Negative for dysphoric mood and suicidal ideas.    Objective:  BP 137/89    Pulse 66   Ht 6' (1.829 m)   Wt 235 lb 9.6 oz (106.9 kg)   SpO2 98%   BMI 31.95 kg/m   BP/Weight 02/04/2020 11/04/2019 09/17/930  Systolic BP 355 732 202  Diastolic BP 89 69 95  Wt. (Lbs) 235.6 231.2 246  BMI 31.95 31.36 33.36      Physical Exam Constitutional:      Appearance: He is well-developed.  Neck:     Vascular: No JVD.  Cardiovascular:     Rate and Rhythm: Normal rate.     Heart sounds: Normal heart sounds. No murmur heard.   Pulmonary:     Effort: Pulmonary effort is normal.     Breath sounds: Normal breath sounds. No wheezing or rales.  Chest:     Chest wall: No tenderness.  Abdominal:     General: Bowel sounds are normal. There is no distension.     Palpations: Abdomen is soft. There is no mass.     Tenderness: There is no abdominal tenderness.  Musculoskeletal:        General: Normal range of motion.     Right lower leg: No edema.     Left lower leg: No edema.     Comments: Positive Finkelstein sign in right hand; left hand is normal Negative Phalen's and Tinel signs bilaterally Crepitus and tenderness on range of motion of left knee; right knee is normal  Skin:    Comments: Large lipoma on left upper back  Neurological:     Mental Status: He is alert and oriented to person, place, and time.  Psychiatric:        Mood and Affect: Mood normal.     CMP Latest Ref Rng & Units 07/31/2019 04/29/2019 03/24/2017  Glucose 65 - 99 mg/dL 135(H) 218(H) 140(H)  BUN 6 - 24 mg/dL 18 11 10   Creatinine 0.76 - 1.27 mg/dL 1.13 1.13 1.16  Sodium 134 - 144 mmol/L 138 138 141  Potassium 3.5 - 5.2 mmol/L 4.4 4.2 4.8  Chloride 96 - 106 mmol/L 104 100 103  CO2 20 - 29 mmol/L 22 22 26   Calcium 8.7 - 10.2 mg/dL 9.9 8.7 8.5(L)  Total Protein 6.0 - 8.5 g/dL - 7.2 6.4  Total Bilirubin 0.0 - 1.2 mg/dL - 0.3 <0.2  Alkaline Phos 39 - 117 IU/L - 92 80  AST 0 - 40 IU/L - 16 11  ALT 0 - 44 IU/L - 15 11    Lipid Panel     Component Value Date/Time   CHOL 148 03/24/2017  0918   TRIG 95 03/24/2017 0918   HDL 46 03/24/2017 0918   CHOLHDL 3.2 03/24/2017 0918   CHOLHDL 3.5 07/16/2015 0946   VLDL 21 07/16/2015 0946   LDLCALC 83 03/24/2017 0918    CBC    Component Value Date/Time   WBC 8.4 07/16/2015 0946   RBC  5.20 07/16/2015 0946   HGB 15.6 07/16/2015 0946   HCT 45.6 07/16/2015 0946   PLT 463 (H) 07/16/2015 0946   MCV 87.7 07/16/2015 0946   MCH 30.0 07/16/2015 0946   MCHC 34.2 07/16/2015 0946   RDW 14.8 07/16/2015 0946   LYMPHSABS 2.5 07/16/2015 0946   MONOABS 0.9 07/16/2015 0946   EOSABS 0.3 07/16/2015 0946   BASOSABS 0.1 07/16/2015 0946    Lab Results  Component Value Date   HGBA1C 7.6 (A) 02/04/2020    Assessment & Plan:  1. Type 2 diabetes mellitus with other specified complication, without long-term current use of insulin (HCC) Slightly above goal with A1c of 7.6; goal is less than 7.0 Advised to resume glipizide Counseled on Diabetic diet, my plate method, 970 minutes of moderate intensity exercise/week Blood sugar logs with fasting goals of 80-120 mg/dl, random of less than 180 and in the event of sugars less than 60 mg/dl or greater than 400 mg/dl encouraged to notify the clinic. Advised on the need for annual eye exams, annual foot exams, Pneumonia vaccine. - POCT glucose (manual entry) - POCT glycosylated hemoglobin (Hb A1C) - glipiZIDE (GLUCOTROL) 5 MG tablet; Take 0.5 tablets (2.5 mg total) by mouth 2 (two) times daily before a meal.  Dispense: 30 tablet; Refill: 6 - metFORMIN (GLUCOPHAGE) 500 MG tablet; Take 2 tablets (1,000 mg total) by mouth 2 (two) times daily with a meal.  Dispense: 120 tablet; Refill: 6 - atorvastatin (LIPITOR) 20 MG tablet; Take 1 tablet (20 mg total) by mouth daily.  Dispense: 30 tablet; Refill: 6 - Basic Metabolic Panel  2. Photodermatitis Uncontrolled Currently on triamcinolone - Ambulatory referral to Dermatology - predniSONE (DELTASONE) 20 MG tablet; Take 1 tablet (20 mg total) by mouth daily with  breakfast.  Dispense: 5 tablet; Refill: 0  3. 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 Chronic pain of left knee Likely osteoarthritis May benefit from additional corticosteroid injection - meloxicam (MOBIC) 7.5 MG tablet; Take 1 tablet (7.5 mg total) by mouth daily.  Dispense: 30 tablet; Refill: 6  5. Other diabetic neurological complication associated with type 2 diabetes mellitus (HCC) Stable - gabapentin (NEURONTIN) 300 MG capsule; Take 1 capsule (300 mg total) by mouth 2 (two) times daily.  Dispense: 60 capsule; Refill: 6  6. Tenosynovitis, de Quervain Continue with brace Short course of prednisone May benefit from corticosteroid injection    Meds ordered this encounter  Medications  . glipiZIDE (GLUCOTROL) 5 MG tablet    Sig: Take 0.5 tablets (2.5 mg total) by mouth 2 (two) times daily before a meal.    Dispense:  30 tablet    Refill:  6  . meloxicam (MOBIC) 7.5 MG tablet    Sig: Take 1 tablet (7.5 mg total) by mouth daily.    Dispense:  30 tablet    Refill:  6  . predniSONE (DELTASONE) 20 MG tablet    Sig: Take 1 tablet (20 mg total) by mouth daily with breakfast.    Dispense:  5 tablet    Refill:  0  . metFORMIN (GLUCOPHAGE) 500 MG tablet    Sig: Take 2 tablets (1,000 mg total) by mouth 2 (two) times daily with a meal.    Dispense:  120 tablet    Refill:  6  . atorvastatin (LIPITOR) 20 MG tablet  Sig: Take 1 tablet (20 mg total) by mouth daily.    Dispense:  30 tablet    Refill:  6  . gabapentin (NEURONTIN) 300 MG capsule    Sig: Take 1 capsule (300 mg total) by mouth 2 (two) times daily.    Dispense:  60 capsule    Refill:  6    Follow-up: Return in about 6 months (around 08/04/2020) for Chronic disease management.       Charlott Rakes, MD, FAAFP. [facility], [place]   02/04/2020, 4:24 PM

## 2020-02-05 LAB — BASIC METABOLIC PANEL
BUN/Creatinine Ratio: 14 (ref 9–20)
BUN: 13 mg/dL (ref 6–24)
CO2: 25 mmol/L (ref 20–29)
Calcium: 9.3 mg/dL (ref 8.7–10.2)
Chloride: 103 mmol/L (ref 96–106)
Creatinine, Ser: 0.92 mg/dL (ref 0.76–1.27)
GFR calc Af Amer: 107 mL/min/{1.73_m2} (ref >59)
GFR calc non Af Amer: 93 mL/min/{1.73_m2} (ref >59)
Glucose: 133 mg/dL — ABNORMAL HIGH (ref 65–99)
Potassium: 4.4 mmol/L (ref 3.5–5.2)
Sodium: 140 mmol/L (ref 134–144)

## 2020-02-07 ENCOUNTER — Telehealth: Payer: Self-pay

## 2020-02-07 NOTE — Telephone Encounter (Signed)
Patient was called and a voicemail was left informing patient of normal results.

## 2020-02-07 NOTE — Telephone Encounter (Signed)
-----   Message from Charlott Rakes, MD sent at 02/05/2020  2:01 PM EDT ----- Please inform the patient that labs are normal. Thank you.

## 2020-02-13 MED FILL — ATORVASTATIN CALCIUM 20 MG: 20 | 30 days supply | Qty: 30 | Fill #0

## 2020-02-13 MED FILL — glipiZIDE 5 MG TABS: 5 | 30 days supply | Qty: 30 | Fill #0

## 2020-02-13 MED FILL — MELOXICAM 7.5 MG TABLET: 7.5 | 30 days supply | Qty: 30 | Fill #0

## 2020-02-13 MED FILL — METFORMIN HCL 500 MG TABS: 500 | 30 days supply | Qty: 120 | Fill #0

## 2020-02-13 MED FILL — GABAPENTIN 300 MG CAPSULE: 300 | 30 days supply | Qty: 60 | Fill #0

## 2020-02-13 MED FILL — predniSONE 20 MG TABS: 20 | 5 days supply | Qty: 5 | Fill #0

## 2020-07-13 ENCOUNTER — Encounter: Payer: Self-pay | Admitting: Family Medicine

## 2020-07-13 ENCOUNTER — Ambulatory Visit: Payer: Self-pay | Attending: Family Medicine | Admitting: Family Medicine

## 2020-07-13 ENCOUNTER — Other Ambulatory Visit: Payer: Self-pay | Admitting: Family Medicine

## 2020-07-13 ENCOUNTER — Other Ambulatory Visit: Payer: Self-pay

## 2020-07-13 VITALS — BP 129/79 | HR 73 | Ht 73.0 in | Wt 229.2 lb

## 2020-07-13 DIAGNOSIS — Z1211 Encounter for screening for malignant neoplasm of colon: Secondary | ICD-10-CM

## 2020-07-13 DIAGNOSIS — R399 Unspecified symptoms and signs involving the genitourinary system: Secondary | ICD-10-CM

## 2020-07-13 DIAGNOSIS — E1169 Type 2 diabetes mellitus with other specified complication: Secondary | ICD-10-CM

## 2020-07-13 DIAGNOSIS — E1149 Type 2 diabetes mellitus with other diabetic neurological complication: Secondary | ICD-10-CM

## 2020-07-13 DIAGNOSIS — M1712 Unilateral primary osteoarthritis, left knee: Secondary | ICD-10-CM

## 2020-07-13 DIAGNOSIS — L568 Other specified acute skin changes due to ultraviolet radiation: Secondary | ICD-10-CM

## 2020-07-13 LAB — POCT GLYCOSYLATED HEMOGLOBIN (HGB A1C): HbA1c, POC (controlled diabetic range): 8 % — AB (ref 0.0–7.0)

## 2020-07-13 LAB — GLUCOSE, POCT (MANUAL RESULT ENTRY): POC Glucose: 210 mg/dl — AB (ref 70–99)

## 2020-07-13 MED ORDER — GABAPENTIN 300 MG PO CAPS: 300.0000 mg | ORAL_CAPSULE | Freq: Two times a day (BID) | ORAL | 6 refills | Status: DC

## 2020-07-13 MED ORDER — LISINOPRIL 2.5 MG PO TABS: 2.5000 mg | ORAL_TABLET | Freq: Every day | ORAL | 6 refills | Status: DC

## 2020-07-13 MED ORDER — ATORVASTATIN CALCIUM 20 MG PO TABS: 20.0000 mg | ORAL_TABLET | Freq: Every day | ORAL | 6 refills | Status: DC

## 2020-07-13 MED ORDER — GLIPIZIDE 5 MG PO TABS: 2.5000 mg | ORAL_TABLET | Freq: Two times a day (BID) | ORAL | 6 refills | Status: DC

## 2020-07-13 MED ORDER — TAMSULOSIN HCL 0.4 MG PO CAPS: 0.4000 mg | ORAL_CAPSULE | Freq: Every day | ORAL | 3 refills | Status: DC

## 2020-07-13 MED ORDER — MELOXICAM 15 MG PO TABS
15.0000 mg | ORAL_TABLET | Freq: Every day | ORAL | 1 refills | Status: DC
Start: 2020-07-13 — End: 2020-07-13

## 2020-07-13 MED ORDER — CLOBETASOL PROPIONATE 0.05 % EX CREA
1.0000 | TOPICAL_CREAM | Freq: Two times a day (BID) | CUTANEOUS | 1 refills | Status: DC
Start: 2020-07-13 — End: 2020-07-13

## 2020-07-13 NOTE — Patient Instructions (Signed)

## 2020-07-13 NOTE — Progress Notes (Signed)
Needs medication refills. Real bad sunburn. Has been out of all medications.

## 2020-07-13 NOTE — Progress Notes (Signed)
Established Patient Office Visit  Subjective:  Patient ID: John Martinez, male    DOB: Jul 14, 1960  Age: 60 y.o. MRN: [id number]  CC:  Chief Complaint  Patient presents with  . Diabetes    HPI MALONE VANBLARCOM is a 60 year old male with a history of uncontrolled type 2 DM (A1c 8.0), hypertension, osteoarthritis and photodermatitis who presents today for a 6 month follow up visit. He reported being out of all medications for at least a month.   The photodermatitis is still bothersome causing itching and burning at the sun exposed areas mainly on the left side. The rash is extensive covering the L side of face, neck and arm. There is hyperpigmentation of the flat rash with associated desquamation. The patient reported having to stay mostly indoors due to how uncomfortable the rash gets. There was a referral to dermatology last visit which the patient was unable to see due to lack of insurance.   In regards to his knee pain, the patient has been unable to see an orthopedic specialist yet. Reports that he does not think the meloxicam is helping the pain. The left knee remains worse than the right knee.   For his tenosynovitis of the wrist, he said that the brace has been helping but there is still pain and swelling in the area.   Patient reported having nocturia which wakes him up 2-3x per night.   The pt denied numbness and/or tingling in extremities, syncope or dizziness, shortness of breath, and chest pain.   Past Medical History:  Diagnosis Date  . Diabetes mellitus without complication ([place])   . Sleep apnea     Past Surgical History:  Procedure Laterality Date  . ROTATOR CUFF REPAIR      No family history on file.  Social History   Socioeconomic History  . Marital status: Married    Spouse name: Not on file  . Number of children: Not on file  . Years of education: Not on file  . Highest education level: Not on file  Occupational History  . Not on file  Tobacco Use  .  Smoking status: Current Every Day Smoker    Packs/day: 0.25    Types: Cigarettes  . Smokeless tobacco: Never Used  Substance and Sexual Activity  . Alcohol use: No  . Drug use: No  . Sexual activity: Not on file  Other Topics Concern  . Not on file  Social History Narrative  . Not on file   Social Determinants of Health   Financial Resource Strain: Not on file  Food Insecurity: Not on file  Transportation Needs: Not on file  Physical Activity: Not on file  Stress: Not on file  Social Connections: Not on file  Intimate Partner Violence: Not on file    Outpatient Medications Prior to Visit  Medication Sig Dispense Refill  . Blood Glucose Monitoring Suppl (TRUE METRIX METER) DEVI 1 each by Does not apply route 3 (three) times daily before meals. 1 Device 0  . diphenhydrAMINE (BENADRYL) 25 MG tablet Take 1 tablet (25 mg total) by mouth every 8 (eight) hours as needed. 14 tablet 0  . famotidine (PEPCID) 20 MG tablet Take 1 tablet (20 mg total) by mouth 2 (two) times daily. 10 tablet 0  . glucose blood (TRUE METRIX BLOOD GLUCOSE TEST) test strip Used 3 times daily as directed before meals 100 each 12  . metFORMIN (GLUCOPHAGE) 500 MG tablet Take 2 tablets (1,000 mg total) by  mouth 2 (two) times daily with a meal. 120 tablet 6  . neomycin-polymyxin-pramoxine (NEOSPORIN PLUS) 1 % cream Apply 1 application topically 3 (three) times daily as needed (wound card). Reported on 07/29/2015    . Pramoxine-Benzalkonium Cl 1-0.13 % LIQD Apply 1 application topically 3 (three) times daily as needed (wound card). Reported on 07/29/2015    . TRUEPLUS LANCETS 28G MISC 1 each by Does not apply route 3 (three) times daily before meals. 100 each 12  . atorvastatin (LIPITOR) 20 MG tablet Take 1 tablet (20 mg total) by mouth daily. 30 tablet 6  . gabapentin (NEURONTIN) 300 MG capsule Take 1 capsule (300 mg total) by mouth 2 (two) times daily. 60 capsule 6  . glipiZIDE (GLUCOTROL) 5 MG tablet Take 0.5 tablets  (2.5 mg total) by mouth 2 (two) times daily before a meal. 30 tablet 6  . lisinopril (ZESTRIL) 2.5 MG tablet Take 1 tablet (2.5 mg total) by mouth daily. 30 tablet 6  . meloxicam (MOBIC) 7.5 MG tablet Take 1 tablet (7.5 mg total) by mouth daily. 30 tablet 6  . triamcinolone cream (KENALOG) 0.1 % Apply 1 application topically 2 (two) times daily. 80 g 1  . predniSONE (DELTASONE) 20 MG tablet Take 1 tablet (20 mg total) by mouth daily with breakfast. (Patient not taking: Reported on 07/13/2020) 5 tablet 0   No facility-administered medications prior to visit.    No Known Allergies  ROS Review of Systems  Constitutional: Negative for activity change and appetite change.  HENT: Negative for sinus pressure and sore throat.   Eyes: Reports floaters in vision Respiratory: Negative for cough, chest tightness and shortness of breath.   Cardiovascular: Negative for chest pain and leg swelling.  Gastrointestinal: Negative for abdominal distention, abdominal pain, constipation and diarrhea.  Endocrine: Negative.   Genitourinary:   See HPI Musculoskeletal:       See HPI Skin:  See HPI Allergic/Immunologic: Negative.   Neurological: Negative for weakness, light-headedness and numbness.  Psychiatric/Behavioral: Negative for dysphoric mood and suicidal ideas.     Objective:    Physical Exam Constitutional:      Appearance: He is well-developed.  Neck:     Vascular: No JVD.  Cardiovascular:     Rate and Rhythm: Normal rate.     Heart sounds: Normal heart sounds. No murmur heard.  Pulmonary:     Effort: Pulmonary effort is normal.     Breath sounds: Normal breath sounds. No wheezing or rales.  Chest:     Chest wall: No tenderness.  Abdominal:     General: Bowel sounds are normal. There is no distension.     Palpations: Abdomen is soft. There is no mass.     Tenderness: There is no abdominal tenderness.  Musculoskeletal:        General: Normal range of motion.     Right lower leg: No  edema.     Left lower leg: No edema.     Comments:  Tenderness on range of motion of left knee; right knee is normal  Negative posterior+anterior drawer sign Skin:    Comments: skin rash predominately on the left side + Large lipoma on left upper back Neurological:     Mental Status: He is alert and oriented to person, place, and time.  Psychiatric:        Mood and Affect: Mood normal.    BP 129/79   Pulse 73   Ht 6' 1"  (1.854 m)   Wt 229 lb  3.2 oz (104 kg)   SpO2 98%   BMI 30.24 kg/m  Wt Readings from Last 3 Encounters:  07/13/20 229 lb 3.2 oz (104 kg)  02/04/20 235 lb 9.6 oz (106.9 kg)  11/04/19 231 lb 3.2 oz (104.9 kg)     Health Maintenance Due  Topic Date Due  . PNEUMOCOCCAL POLYSACCHARIDE VACCINE AGE 60-64 HIGH RISK  Never done  . COVID-19 Vaccine (1) Never done  . OPHTHALMOLOGY EXAM  Never done  . TETANUS/TDAP  Never done  . COLONOSCOPY (Pts 45-45yr Insurance coverage will need to be confirmed)  Never done  . FOOT EXAM  03/22/2018    There are no preventive care reminders to display for this patient.  No results found for: TSH Lab Results  Component Value Date   WBC 8.4 07/16/2015   HGB 15.6 07/16/2015   HCT 45.6 07/16/2015   MCV 87.7 07/16/2015   PLT 463 (H) 07/16/2015   Lab Results  Component Value Date   NA 140 02/04/2020   K 4.4 02/04/2020   CO2 25 02/04/2020   GLUCOSE 133 (H) 02/04/2020   BUN 13 02/04/2020   CREATININE 0.92 02/04/2020   BILITOT 0.3 04/29/2019   ALKPHOS 92 04/29/2019   AST 16 04/29/2019   ALT 15 04/29/2019   PROT 7.2 04/29/2019   ALBUMIN 4.0 04/29/2019   CALCIUM 9.3 02/04/2020   ANIONGAP 10 10/24/2014   Lab Results  Component Value Date   CHOL 148 03/24/2017   Lab Results  Component Value Date   HDL 46 03/24/2017   Lab Results  Component Value Date   LDLCALC 83 03/24/2017   Lab Results  Component Value Date   TRIG 95 03/24/2017   Lab Results  Component Value Date   CHOLHDL 3.2 03/24/2017   Lab Results   Component Value Date   HGBA1C 8.0 (A) 07/13/2020      Assessment & Plan:   Problem List Items Addressed This Visit      Endocrine   Diabetes mellitus ([place] Uncontrolled diabetes with an A1c of 8.0 secondary to medication non-adherance Pt reports he plans on getting medications today and was counseled on the importance of taking the medications every day as prescribed  Counseled on Diabetic diet, my plate method, 1938minutes of moderate intensity exercise/week Blood sugar logs with fasting goals of 80-120 mg/dl, random of less than 180 and in the event of sugars less than 60 mg/dl or greater than 400 mg/dl encouraged to notify the clinic. Advised on the need for annual eye exams, annual foot exams, Pneumonia vaccine. - POCT glucose (manual entry) - POCT glycosylated hemoglobin (Hb A1C)   Relevant Medications   atorvastatin (LIPITOR) 20 MG tablet   glipiZIDE (GLUCOTROL) 5 MG tablet   lisinopril (ZESTRIL) 2.5 MG tablet   Other Relevant Orders   POCT glucose (manual entry) (Completed)   POCT glycosylated hemoglobin (Hb A1C) (Completed)   CMP14+EGFR   Lipid panel   Microalbumin / creatinine urine ratio   Diabetic neuropathy (HCC) Controlled: Pt reported no signs of numbness, tingling or pain in extremeties   Relevant Medications   atorvastatin (LIPITOR) 20 MG tablet   gabapentin (NEURONTIN) 300 MG capsule   glipiZIDE (GLUCOTROL) 5 MG tablet   lisinopril (ZESTRIL) 2.5 MG tablet     Musculoskeletal and Integument   Osteoarthritis of left knee Increase dosage of meloxicam due to persistent pain in the left knee. Pt was not interested in steroid injections due to failed success in the past.  Patient is in the process of obtaining insurance and will let us know when that is complete in order to place a referral to orthopedics for this issue.    Relevant Medications   meloxicam (MOBIC) 15 MG tablet    Other Visit Diagnoses    Lower urinary tract symptoms   More than  likely due to benign prostatic hyperplasia. Will check PSA and begin tamsulosin in order to relieve the symptoms of nocturia.      Relevant Medications   tamsulosin (FLOMAX) 0.4 MG CAPS capsule   Other Relevant Orders   PSA, total and free   Screening for colon cancer     Orders placed for the FIT test. Counseled patient that this is a yearly screening.   Relevant Orders   Fecal occult blood, imunochemical([facility])   Photodermatitis     Would like to refer to dermatology but pt currently is in the process of obtaining insurance. Switched from triamcinolone to clobetasol in the interim and pt was advised to contact us once insurance has been finalized in order to place the referral to dermatology.  Counseled patient to avoid sunlight exposure as much as possible by wearing long sleeves, pants and hats.   Relevant Medications   clobetasol cream (TEMOVATE) 0.05 %      Meds ordered this encounter  Medications  . atorvastatin (LIPITOR) 20 MG tablet    Sig: Take 1 tablet (20 mg total) by mouth daily.    Dispense:  30 tablet    Refill:  6  . gabapentin (NEURONTIN) 300 MG capsule    Sig: Take 1 capsule (300 mg total) by mouth 2 (two) times daily.    Dispense:  60 capsule    Refill:  6  . glipiZIDE (GLUCOTROL) 5 MG tablet    Sig: Take 0.5 tablets (2.5 mg total) by mouth 2 (two) times daily before a meal.    Dispense:  30 tablet    Refill:  6  . lisinopril (ZESTRIL) 2.5 MG tablet    Sig: Take 1 tablet (2.5 mg total) by mouth daily.    Dispense:  30 tablet    Refill:  6  . meloxicam (MOBIC) 15 MG tablet    Sig: Take 1 tablet (15 mg total) by mouth daily.    Dispense:  90 tablet    Refill:  1    Dose increase  . tamsulosin (FLOMAX) 0.4 MG CAPS capsule    Sig: Take 1 capsule (0.4 mg total) by mouth daily.    Dispense:  30 capsule    Refill:  3  . clobetasol cream (TEMOVATE) 0.05 %    Sig: Apply 1 application topically 2 (two) times daily.    Dispense:  45 g    Refill:  1     Discontinue triamcinolone    Follow-up: Return in about 3 months (around 10/13/2020) for Chronic disease management.    Vance Peper, Medical Student   Evaluation and management procedures were performed by me with Medical Student in attendance, note written by Medical Student under my supervision and collaboration. I have reviewed the note and I agree with the management and plan.  In summary Mr. Bohnet presents for chronic disease management and has been out of his medications hence an elevated A1c and I will hold off on making any regimen changes due to the above.  He has been encouraged to be more compliant and we will reassess his A1c in 3 months.  For his photodermatitis which has  not responded to topical steroids I will place him on a more potent steroid and hopefully he can apply for the [facility] financial discount so I can refer him to dermatology for optimization of management.  Charlott Rakes, MD, FAAFP. [facility] [place], [place]   07/13/2020, 5:36 PM

## 2020-07-20 ENCOUNTER — Other Ambulatory Visit: Payer: Self-pay

## 2020-07-20 MED FILL — Atorvastatin Calcium Tab 20 MG (Base Equivalent): ORAL | 30 days supply | Qty: 30 | Fill #0 | Status: AC

## 2020-07-20 MED FILL — Clobetasol Propionate Cream 0.05%: CUTANEOUS | 7 days supply | Qty: 45 | Fill #0 | Status: CN

## 2020-07-20 MED FILL — Meloxicam Tab 15 MG: ORAL | 30 days supply | Qty: 30 | Fill #0 | Status: AC

## 2020-07-20 MED FILL — Gabapentin Cap 300 MG: ORAL | 30 days supply | Qty: 60 | Fill #0 | Status: AC

## 2020-07-20 MED FILL — Lisinopril Tab 2.5 MG: ORAL | 30 days supply | Qty: 30 | Fill #0 | Status: AC

## 2020-07-20 MED FILL — Tamsulosin HCl Cap 0.4 MG: ORAL | 30 days supply | Qty: 30 | Fill #0 | Status: AC

## 2020-07-20 MED FILL — Glipizide Tab 5 MG: ORAL | 30 days supply | Qty: 30 | Fill #0 | Status: AC

## 2020-07-21 ENCOUNTER — Other Ambulatory Visit: Payer: Self-pay

## 2020-07-22 ENCOUNTER — Other Ambulatory Visit: Payer: Self-pay

## 2020-07-29 ENCOUNTER — Other Ambulatory Visit: Payer: Self-pay

## 2020-10-13 ENCOUNTER — Other Ambulatory Visit: Payer: Self-pay

## 2020-10-13 ENCOUNTER — Ambulatory Visit: Payer: Self-pay | Attending: Family Medicine | Admitting: Family Medicine

## 2020-12-07 ENCOUNTER — Other Ambulatory Visit (HOSPITAL_COMMUNITY): Payer: Self-pay

## 2021-02-22 ENCOUNTER — Ambulatory Visit: Payer: Self-pay

## 2021-02-22 ENCOUNTER — Other Ambulatory Visit: Payer: Self-pay

## 2021-02-22 MED FILL — Glipizide Tab 5 MG: ORAL | 30 days supply | Qty: 30 | Fill #1 | Status: AC

## 2021-02-22 MED FILL — Atorvastatin Calcium Tab 20 MG (Base Equivalent): ORAL | 30 days supply | Qty: 30 | Fill #1 | Status: AC

## 2021-02-22 MED FILL — Tamsulosin HCl Cap 0.4 MG: ORAL | 30 days supply | Qty: 30 | Fill #1 | Status: AC

## 2021-02-22 MED FILL — Lisinopril Tab 2.5 MG: ORAL | 30 days supply | Qty: 30 | Fill #1 | Status: AC

## 2021-02-22 MED FILL — Gabapentin Cap 300 MG: ORAL | 30 days supply | Qty: 60 | Fill #1 | Status: AC

## 2021-02-22 NOTE — Telephone Encounter (Signed)
Pt called with lower left leg swelling. He also states that it appears to be darkening. Swelling is only in 1 leg and has been swollen for 2 weeks.  Pt reports no pain, redness or warmth.   Pt is also out of medications.  Was only able to schedule for weds. Sent a message in teams and Galin was able to secure an appointment at 2:10pm 02/23/2021     Answer Assessment - Initial Assessment Questions 1. ONSET: "When did the swelling start?" (e.g., minutes, hours, days)     2 weeks ago 2. LOCATION: "What part of the leg is swollen?"  "Are both legs swollen or just one leg?"     Left leg below the knee 3. SEVERITY: "How bad is the swelling?" (e.g., localized; mild, moderate, severe)  - Localized - small area of swelling localized to one leg  - MILD pedal edema - swelling limited to foot and ankle, pitting edema < 1/4 inch (6 mm) deep, rest and elevation eliminate most or all swelling  - MODERATE edema - swelling of lower leg to knee, pitting edema > 1/4 inch (6 mm) deep, rest and elevation only partially reduce swelling  - SEVERE edema - swelling extends above knee, facial or hand swelling present      mild 4. REDNESS: "Does the swelling look red or infected?"     no 5. PAIN: "Is the swelling painful to touch?" If Yes, ask: "How painful is it?"   (Scale 1-10; mild, moderate or severe)     no 6. FEVER: "Do you have a fever?" If Yes, ask: "What is it, how was it measured, and when did it start?"      no 7. CAUSE: "What do you think is causing the leg swelling?"     unknown 8. MEDICAL HISTORY: "Do you have a history of heart failure, kidney disease, liver failure, or cancer?"      9. RECURRENT SYMPTOM: "Have you had leg swelling before?" If Yes, ask: "When was the last time?" "What happened that time?"      10. OTHER SYMPTOMS: "Do you have any other symptoms?" (e.g., chest pain, difficulty breathing)        11. PREGNANCY: "Is there any chance you are pregnant?" "When was your last menstrual  period?"  Protocols used: Leg Swelling and Edema-A-AH

## 2021-02-22 NOTE — Telephone Encounter (Signed)
Reason for Disposition . [1] MODERATE leg swelling (e.g., swelling extends up to knees) AND [2] new-onset or worsening  Protocols used: Leg Swelling and Edema-A-AH

## 2021-02-23 ENCOUNTER — Other Ambulatory Visit: Payer: Self-pay

## 2021-02-23 ENCOUNTER — Ambulatory Visit: Payer: Self-pay | Attending: Physician Assistant | Admitting: Family Medicine

## 2021-02-23 ENCOUNTER — Encounter: Payer: Self-pay | Admitting: Family Medicine

## 2021-02-23 VITALS — BP 136/85 | HR 78 | Ht 73.0 in | Wt 240.0 lb

## 2021-02-23 DIAGNOSIS — Z1211 Encounter for screening for malignant neoplasm of colon: Secondary | ICD-10-CM

## 2021-02-23 DIAGNOSIS — M79672 Pain in left foot: Secondary | ICD-10-CM

## 2021-02-23 DIAGNOSIS — E1169 Type 2 diabetes mellitus with other specified complication: Secondary | ICD-10-CM

## 2021-02-23 LAB — POCT GLYCOSYLATED HEMOGLOBIN (HGB A1C): HbA1c, POC (controlled diabetic range): 10.9 % — AB (ref 0.0–7.0)

## 2021-02-23 LAB — GLUCOSE, POCT (MANUAL RESULT ENTRY): POC Glucose: 238 mg/dl — AB (ref 70–99)

## 2021-02-23 MED ORDER — INSULIN PEN NEEDLE 31G X 5 MM MISC
1.0000 | Freq: Every day | 5 refills | Status: AC
  Filled 2021-02-23: qty 100, 30d supply, fill #0

## 2021-02-23 MED ORDER — METFORMIN HCL 500 MG PO TABS
1000.0000 mg | ORAL_TABLET | Freq: Two times a day (BID) | ORAL | 6 refills | Status: AC
  Filled 2021-02-23: qty 120, 30d supply, fill #0

## 2021-02-23 MED ORDER — GLIPIZIDE 5 MG PO TABS
5.0000 mg | ORAL_TABLET | Freq: Two times a day (BID) | ORAL | 6 refills | Status: AC
  Filled 2021-02-23: qty 60, 30d supply, fill #0

## 2021-02-23 MED ORDER — TRULICITY 0.75 MG/0.5ML ~~LOC~~ SOAJ
0.7500 mg | SUBCUTANEOUS | 6 refills | Status: AC
  Filled 2021-02-23: qty 2, 28d supply, fill #0

## 2021-02-23 NOTE — Patient Instructions (Signed)
Dulaglutide Injection What is this medication? DULAGLUTIDE (DOO la GLOO tide) treats type 2 diabetes. It works by increasing insulin levels in your body, which decreases your blood sugar (glucose). It also reduces the amount of sugar released into your blood and slows down your digestion. It can also be used to lower the risk of heart attack and stroke in people with type 2 diabetes. Changes to diet and exercise are often combined with this medication. This medicine may be used for other purposes; ask your health care provider or pharmacist if you have questions. COMMON BRAND NAME(S): Trulicity What should I tell my care team before I take this medication? They need to know if you have any of these conditions: Endocrine tumors (MEN 2) or if someone in your family had these tumors Eye disease, vision problems History of pancreatitis Kidney disease Liver disease Stomach or intestine problems Thyroid cancer or if someone in your family had thyroid cancer An unusual or allergic reaction to dulaglutide, other medications, foods, dyes, or preservatives Pregnant or trying to get pregnant Breast-feeding How should I use this medication? This medication is injected under the skin. You will be taught how to prepare and give it. Take it as directed on the prescription label on the same day of each week. Do NOT prime the pen. Keep taking it unless your care team tells you to stop. If you use this medication with insulin, you should inject this medication and the insulin separately. Do not mix them together. Do not give the injections right next to each other. Change (rotate) injection sites with each injection. This medication comes with INSTRUCTIONS FOR USE. Ask your pharmacist for directions on how to use this medication. Read the information carefully. Talk to your pharmacist or care team if you have questions. It is important that you put your used needles and syringes in a special sharps container. Do  not put them in a trash can. If you do not have a sharps container, call your pharmacist or care team to get one. A special MedGuide will be given to you by the pharmacist with each prescription and refill. Be sure to read this information carefully each time. Talk to your care team about the use of this medication in children. Special care may be needed. Overdosage: If you think you have taken too much of this medicine contact a poison control center or emergency room at once. NOTE: This medicine is only for you. Do not share this medicine with others. What if I miss a dose? If you miss a dose, take it as soon as you can unless it is more than 3 days late. If it is more than 3 days late, skip the missed dose. Take the next dose at the normal time. What may interact with this medication? Other medications for diabetes Many medications may cause changes in blood sugar, these include: Alcohol containing beverages Antiviral medications for HIV or AIDS Aspirin and aspirin-like medications Certain medications for blood pressure, heart disease, irregular heart beat Chromium Diuretics Male hormones, such as estrogens or progestins, birth control pills Fenofibrate Gemfibrozil Isoniazid Lanreotide Male hormones or anabolic steroids MAOIs like Carbex, Eldepryl, Marplan, Nardil, and Parnate Medications for allergies, asthma, cold, or cough Medications for depression, anxiety, or psychotic disturbances Medications for weight loss Niacin Nicotine NSAIDs, medications for pain and inflammation, like ibuprofen or naproxen Octreotide Pasireotide Pentamidine Phenytoin Probenecid Quinolone antibiotics such as ciprofloxacin, levofloxacin, ofloxacin Some herbal dietary supplements Steroid medications such as prednisone or cortisone Sulfamethoxazole;   trimethoprim Thyroid hormones Some medications can hide the warning symptoms of low blood sugar (hypoglycemia). You may need to monitor your blood  sugar more closely if you are taking one of these medications. These include: Beta-blockers, often used for high blood pressure or heart problems (examples include atenolol, metoprolol, propranolol) Clonidine Guanethidine Reserpine This list may not describe all possible interactions. Give your health care provider a list of all the medicines, herbs, non-prescription drugs, or dietary supplements you use. Also tell them if you smoke, drink alcohol, or use illegal drugs. Some items may interact with your medicine. What should I watch for while using this medication? Visit your care team for regular checks on your progress. Check with your care team if you have severe diarrhea, nausea, and vomiting, or if you sweat a lot. The loss of too much body fluid may make it dangerous for you to take this medication. A test called the HbA1C (A1C) will be monitored. This is a simple blood test. It measures your blood sugar control over the last 2 to 3 months. You will receive this test every 3 to 6 months. Learn how to check your blood sugar. Learn the symptoms of low and high blood sugar and how to manage them. Always carry a quick-source of sugar with you in case you have symptoms of low blood sugar. Examples include hard sugar candy or glucose tablets. Make sure others know that you can choke if you eat or drink when you develop serious symptoms of low blood sugar, such as seizures or unconsciousness. Get medical help at once. Tell your care team if you have high blood sugar. You might need to change the dose of your medication. If you are sick or exercising more than usual, you may need to change the dose of your medication. Do not skip meals. Ask your care team if you should avoid alcohol. Many nonprescription cough and cold products contain sugar or alcohol. These can affect blood sugar. Pens should never be shared. Even if the needle is changed, sharing may result in passing of viruses like hepatitis or  HIV. Wear a medical ID bracelet or chain. Carry a card that describes your condition. List the medications and doses you take on the card. What side effects may I notice from receiving this medication? Side effects that you should report to your care team as soon as possible: Allergic reactions--skin rash, itching, hives, swelling of the face, lips, tongue, or throat Change in vision Dehydration--increased thirst, dry mouth, feeling faint or lightheaded, headache, dark yellow or brown urine Kidney injury--decrease in the amount of urine, swelling of the ankles, hands, or feet Pancreatitis--severe stomach pain that spreads to your back or gets worse after eating or when touched, fever, nausea, vomiting Thyroid cancer--new mass or lump in the neck, pain or trouble swallowing, trouble breathing, hoarseness Side effects that usually do not require medical attention (report to your care team if they continue or are bothersome): Diarrhea Loss of appetite Nausea Stomach pain Vomiting This list may not describe all possible side effects. Call your doctor for medical advice about side effects. You may report side effects to FDA at 1-800-FDA-1088. Where should I keep my medication? Keep out of the reach of children and pets. Refrigeration (preferred): Store unopened pens in a refrigerator between 2 and 8 degrees C (36 and 46 degrees F). Keep it in the original carton until you are ready to take it. Do not freeze or use if the medication has been frozen.  Protect from light. Get rid of any unused medication after the expiration date on the label. Room Temperature: The pen may be stored at room temperature below 30 degrees C (86 degrees F) for up to a total of 14 days if needed. Protect from light. Avoid exposure to extreme heat. If it is stored at room temperature, throw away any unused medication after 14 days or after it expires, whichever is first. To get rid of medications that are no longer needed or  have expired: Take the medication to a medication take-back program. Check with your pharmacy or law enforcement to find a location. If you cannot return the medication, ask your pharmacist or care team how to get rid of this medication safely. NOTE: This sheet is a summary. It may not cover all possible information. If you have questions about this medicine, talk to your doctor, pharmacist, or health care provider.  2022 [employer]vier/Gold Standard (2020-07-09 00:00:00)

## 2021-02-23 NOTE — Progress Notes (Signed)
Subjective:  Patient ID: John Martinez, male    DOB: [birth year]  Age: 60 y.o. MRN: [id number]  CC: Diabetes   HPI John Martinez is a 60 y.o. year old male with a history of  uncontrolled type 2 DM (A1c 8.0), hypertension, osteoarthritis and photodermatitis who presents today for a 6 month follow up visit.  Interval History: He complains of about 2 week history of swelling in his L leg with associated darkening of the skin and is present all day but he has no swelling in his R. He only has pain when he walks and pain occurs just around the ankles.Denies fever or myalgia States L knee was swollen initially then radiated to the ankle He has not noticed  redness or warmth; he has not used any OTC meds  Last Uric acid was normal in 07/2019  He has been out of meds x 2 weeks A1c is 10.9 up from 8.0; he has not been compliant with a Diabetic diet. Metformin appears on his med list but he has not been taking it.  Past Medical History:  Diagnosis Date   Diabetes mellitus without complication ([place])    Sleep apnea     Past Surgical History:  Procedure Laterality Date   ROTATOR CUFF REPAIR      No family history on file. 2 to No Known Allergies  Outpatient Medications Prior to Visit  Medication Sig Dispense Refill   atorvastatin (LIPITOR) 20 MG tablet TAKE 1 TABLET (20 MG TOTAL) BY MOUTH DAILY. 30 tablet 6   Blood Glucose Monitoring Suppl (TRUE METRIX METER) DEVI 1 each by Does not apply route 3 (three) times daily before meals. 1 Device 0   diphenhydrAMINE (BENADRYL) 25 MG tablet Take 1 tablet (25 mg total) by mouth every 8 (eight) hours as needed. 14 tablet 0   gabapentin (NEURONTIN) 300 MG capsule TAKE 1 CAPSULE (300 MG TOTAL) BY MOUTH 2 (TWO) TIMES DAILY. 60 capsule 6   glucose blood (TRUE METRIX BLOOD GLUCOSE TEST) test strip Used 3 times daily as directed before meals 100 each 12   lisinopril (ZESTRIL) 2.5 MG tablet TAKE 1 TABLET (2.5 MG TOTAL) BY MOUTH DAILY. 30 tablet 6    tamsulosin (FLOMAX) 0.4 MG CAPS capsule TAKE 1 CAPSULE (0.4 MG TOTAL) BY MOUTH DAILY. 30 capsule 3   TRUEPLUS LANCETS 28G MISC 1 each by Does not apply route 3 (three) times daily before meals. 100 each 12   glipiZIDE (GLUCOTROL) 5 MG tablet TAKE 0.5 TABLETS (2.5 MG TOTAL) BY MOUTH 2 (TWO) TIMES DAILY BEFORE A MEAL. 30 tablet 6   clobetasol cream (TEMOVATE) 1.94 % APPLY 1 APPLICATION TOPICALLY 2 (TWO) TIMES DAILY. (Patient not taking: Reported on 02/23/2021) 45 g 1   famotidine (PEPCID) 20 MG tablet Take 1 tablet (20 mg total) by mouth 2 (two) times daily. (Patient not taking: Reported on 02/23/2021) 10 tablet 0   meloxicam (MOBIC) 15 MG tablet TAKE 1 TABLET (15 MG TOTAL) BY MOUTH DAILY. (Patient not taking: Reported on 02/23/2021) 90 tablet 1   neomycin-polymyxin-pramoxine (NEOSPORIN PLUS) 1 % cream Apply 1 application topically 3 (three) times daily as needed (wound card). Reported on 07/29/2015 (Patient not taking: Reported on 02/23/2021)     Pramoxine-Benzalkonium Cl 1-0.13 % LIQD Apply 1 application topically 3 (three) times daily as needed (wound card). Reported on 07/29/2015 (Patient not taking: Reported on 02/23/2021)     metFORMIN (GLUCOPHAGE) 500 MG tablet Take 2 tablets (1,000 mg total) by mouth 2 (  two) times daily with a meal. (Patient not taking: Reported on 02/23/2021) 120 tablet 6   No facility-administered medications prior to visit.     ROS Review of Systems  Constitutional:  Negative for activity change and appetite change.  HENT:  Negative for sinus pressure and sore throat.   Eyes:  Negative for visual disturbance.  Respiratory:  Negative for cough, chest tightness and shortness of breath.   Cardiovascular:  Positive for leg swelling. Negative for chest pain.  Gastrointestinal:  Negative for abdominal distention, abdominal pain, constipation and diarrhea.  Endocrine: Negative.   Genitourinary:  Negative for dysuria.  Musculoskeletal:  Negative for joint swelling and myalgias.   Skin:  Negative for rash.  Allergic/Immunologic: Negative.   Neurological:  Negative for weakness, light-headedness and numbness.  Psychiatric/Behavioral:  Negative for dysphoric mood and suicidal ideas.    Objective:  BP 136/85   Pulse 78   Ht 6' 1"  (1.854 m)   Wt 240 lb (108.9 kg)   SpO2 97%   BMI 31.66 kg/m   BP/Weight 02/23/2021 07/13/2020 97/28/2060  Systolic BP 156 153 794  Diastolic BP 85 79 89  Wt. (Lbs) 240 229.2 235.6  BMI 31.66 30.24 31.95      Physical Exam Constitutional:      Appearance: He is well-developed.  Cardiovascular:     Rate and Rhythm: Normal rate.     Heart sounds: Normal heart sounds. No murmur heard. Pulmonary:     Effort: Pulmonary effort is normal.     Breath sounds: Normal breath sounds. No wheezing or rales.  Chest:     Chest wall: No tenderness.  Abdominal:     General: Bowel sounds are normal. There is no distension.     Palpations: Abdomen is soft. There is no mass.     Tenderness: There is no abdominal tenderness.  Musculoskeletal:        General: Normal range of motion.     Right lower leg: No edema.     Left lower leg: No edema.     Comments: Edema of left ankle and medial aspect of left foot with associated deformity.  No tenderness on inversion or eversion.  No erythema or warmth noted  Neurological:     Mental Status: He is alert and oriented to person, place, and time.  Psychiatric:        Mood and Affect: Mood normal.   Diabetic Foot Exam - Simple   No data filed     CMP Latest Ref Rng & Units 02/04/2020 07/31/2019 04/29/2019  Glucose 65 - 99 mg/dL 133(H) 135(H) 218(H)  BUN 6 - 24 mg/dL 13 18 11   Creatinine 0.76 - 1.27 mg/dL 0.92 1.13 1.13  Sodium 134 - 144 mmol/L 140 138 138  Potassium 3.5 - 5.2 mmol/L 4.4 4.4 4.2  Chloride 96 - 106 mmol/L 103 104 100  CO2 20 - 29 mmol/L 25 22 22   Calcium 8.7 - 10.2 mg/dL 9.3 9.9 8.7  Total Protein 6.0 - 8.5 g/dL - - 7.2  Total Bilirubin 0.0 - 1.2 mg/dL - - 0.3  Alkaline Phos 39  - 117 IU/L - - 92  AST 0 - 40 IU/L - - 16  ALT 0 - 44 IU/L - - 15    Lipid Panel     Component Value Date/Time   CHOL 148 03/24/2017 0918   TRIG 95 03/24/2017 0918   HDL 46 03/24/2017 0918   CHOLHDL 3.2 03/24/2017 0918   CHOLHDL 3.5 07/16/2015 0946  VLDL 21 07/16/2015 0946   LDLCALC 83 03/24/2017 0918    CBC    Component Value Date/Time   WBC 8.4 07/16/2015 0946   RBC 5.20 07/16/2015 0946   HGB 15.6 07/16/2015 0946   HCT 45.6 07/16/2015 0946   PLT 463 (H) 07/16/2015 0946   MCV 87.7 07/16/2015 0946   MCH 30.0 07/16/2015 0946   MCHC 34.2 07/16/2015 0946   RDW 14.8 07/16/2015 0946   LYMPHSABS 2.5 07/16/2015 0946   MONOABS 0.9 07/16/2015 0946   EOSABS 0.3 07/16/2015 0946   BASOSABS 0.1 07/16/2015 0946    Lab Results  Component Value Date   HGBA1C 10.9 (A) 02/23/2021    Assessment & Plan:   Problem List Items Addressed This Visit       Endocrine   Diabetes mellitus ([place]) - Primary    Uncontrolled with A1c of 10.9 which has trended up from 8.0 previously; goal is less than 7.0 Non compliance with a diabetic diet also contributing. Trulicity added to regimen; clinical pharmacist called in to provide education on administration He will follow-up with Pharm.D. to uptitrate his Trulicity dose Counseled on Diabetic diet, my plate method, 101 minutes of moderate intensity exercise/week Blood sugar logs with fasting goals of 80-120 mg/dl, random of less than 180 and in the event of sugars less than 60 mg/dl or greater than 400 mg/dl encouraged to notify the clinic. Advised on the need for annual eye exams, annual foot exams, Pneumonia vaccine.       Relevant Medications   Dulaglutide (TRULICITY) 7.51 WC/5.8NI SOPN   metFORMIN (GLUCOPHAGE) 500 MG tablet   glipiZIDE (GLUCOTROL) 5 MG tablet   Other Relevant Orders   POCT glucose (manual entry) (Completed)   POCT glycosylated hemoglobin (Hb A1C) (Completed)   LP+Non-HDL Cholesterol   CMP14+EGFR   Microalbumin /  creatinine urine ratio   Other Visit Diagnoses     Screening for colon cancer       Relevant Orders   Fecal occult blood, imunochemical   Left foot pain     Will need to exclude gout High suspicion for Charcot's foot He has not had labs in over a year Once I receive his renal function I will place him on an NSAID   Relevant Orders   Uric Acid   DG Foot Complete Left       Meds ordered this encounter  Medications   Dulaglutide (TRULICITY) 7.78 EU/2.3NT SOPN    Sig: Inject 0.75 mg into the skin once a week.    Dispense:  2 mL    Refill:  6   Insulin Pen Needle 31G X 5 MM MISC    Sig: use route at bedtime.    Dispense:  100 each    Refill:  5   metFORMIN (GLUCOPHAGE) 500 MG tablet    Sig: Take 2 tablets (1,000 mg total) by mouth 2 (two) times daily with a meal.    Dispense:  120 tablet    Refill:  6   glipiZIDE (GLUCOTROL) 5 MG tablet    Sig: Take 1 tablet (5 mg total) by mouth 2 (two) times daily before a meal.    Dispense:  60 tablet    Refill:  6    Dose increase     Follow-up: Return in about 1 month (around 03/25/2021) for Blood sugar evaluation with Lurena Joiner; 3 months with PCP diabetes.       Charlott Rakes, MD, FAAFP. [place] [facility] [place], [place]  [phone]   02/23/2021, 5:30 PM

## 2021-02-23 NOTE — Progress Notes (Signed)
Has swelling and discoloration to ankles.

## 2021-02-23 NOTE — Assessment & Plan Note (Signed)
Uncontrolled with A1c of 10.9 which has trended up from 8.0 previously; goal is less than 7.0 Non compliance with a diabetic diet also contributing. Trulicity added to regimen; clinical pharmacist called in to provide education on administration He will follow-up with Pharm.D. to uptitrate his Trulicity dose Counseled on Diabetic diet, my plate method, 638 minutes of moderate intensity exercise/week Blood sugar logs with fasting goals of 80-120 mg/dl, random of less than 180 and in the event of sugars less than 60 mg/dl or greater than 400 mg/dl encouraged to notify the clinic. Advised on the need for annual eye exams, annual foot exams, Pneumonia vaccine.

## 2021-02-24 ENCOUNTER — Other Ambulatory Visit: Payer: Self-pay

## 2021-02-25 ENCOUNTER — Ambulatory Visit: Payer: Self-pay | Admitting: Physician Assistant

## 2021-03-02 ENCOUNTER — Other Ambulatory Visit: Payer: Self-pay

## 2021-03-25 ENCOUNTER — Ambulatory Visit: Payer: Self-pay | Admitting: Pharmacist

## 2021-05-26 ENCOUNTER — Ambulatory Visit: Payer: Self-pay | Admitting: Family Medicine

## 2022-03-21 ENCOUNTER — Other Ambulatory Visit (HOSPITAL_COMMUNITY): Payer: Self-pay

## 2022-08-10 ENCOUNTER — Emergency Department (HOSPITAL_COMMUNITY)
Admission: EM | Admit: 2022-08-10 | Discharge: 2022-08-10 | Disposition: A | Discharge status: Home / Self Care | Payer: Self-pay

## 2022-09-08 ENCOUNTER — Telehealth: Payer: Self-pay

## 2022-09-08 NOTE — Telephone Encounter (Signed)
Error

## 2022-09-19 ENCOUNTER — Encounter: Payer: Self-pay | Admitting: *Deleted

## 2023-09-14 ENCOUNTER — Telehealth: Payer: Self-pay | Admitting: *Deleted

## 2023-09-14 NOTE — Telephone Encounter (Signed)
   Pre-operative Risk Assessment    Patient Name: John Martinez  DOB: 13-Apr-1961 MRN: [id number]   Date of last office visit: NONE Date of next office visit: 09/15/23 DR. PATWARDHAN NEW PT APPT    Request for Surgical Clearance    Procedure:  LEFT TOTAL KNEE ARTHROPLASTY   Date of Surgery:  Clearance TBD                                Surgeon:  DR. Randal Bury Surgeon's Group or Practice Name:  [facility] Phone number:  [phone] [address] Fax number:  [phone]   Type of Clearance Requested:   - Medical ; NONE INDICATED TO BE HELD   Type of Anesthesia:  Spinal   Additional requests/questions:    Princ[place] Broom   09/14/2023, 2:07 PM

## 2023-09-14 NOTE — Telephone Encounter (Addendum)
   Name: John Martinez  DOB: 03/10/1961  MRN: [id number]  Primary Cardiologist: None  Chart reviewed as part of pre-operative protocol coverage. The patient has an upcoming visit scheduled with Dr. Filiberto Hug on 09/15/2023 at which time clearance can be addressed in case there are any issues that would impact surgical recommendations.  Left total knee arthroplasty is not scheduled until TBD as below. I added preop FYI to appointment note so that provider is aware to address at time of outpatient visit.  Per office protocol the cardiology provider should forward their finalized clearance decision and recommendations regarding antiplatelet therapy to the requesting party below.    I will route this message as FYI to requesting party and remove this message from the preop box as separate preop APP input not needed at this time.   Please call with any questions.  Jude Norton, NP  09/14/2023, 2:47 PM

## 2023-09-15 ENCOUNTER — Ambulatory Visit: Attending: Cardiology | Admitting: Cardiology

## 2023-09-15 DIAGNOSIS — Z01810 Encounter for preprocedural cardiovascular examination: Secondary | ICD-10-CM | POA: Insufficient documentation

## 2023-09-15 NOTE — Progress Notes (Deleted)
  Cardiology Office Note:  .   Date:  09/15/2023  ID:  [name], DOB [birth year], MRN [id number] PCP: Joaquin Mulberry, MD  [facility] Providers Cardiologist:  Fransico Ivy, MD PCP: Joaquin Mulberry, MD  No chief complaint on file.    John Martinez is a 63 y.o. male with *** Discussed the use of AI scribe software for clinical note transcription with the patient, who gave verbal consent to proceed.  History of Present Illness       There were no vitals filed for this visit.    ROS      Studies Reviewed: .        *** Independently interpreted ***/202***: Chol ***, TG ***, HDL ***, LDL *** HbA1C ***% Hb *** Cr *** ***  Risk Assessment/Calculations:   {Does this patient have ATRIAL FIBRILLATION?:[phone]}    Physical Exam   VISIT DIAGNOSES:   ICD-10-CM   1. Preop cardiovascular exam  Z01.810        John Martinez is a 63 y.o. male with *** Assessment and Plan Assessment & Plan       {Are you ordering a CV Procedure (e.g. stress test, cath, DCCV, TEE, etc)?   Press F2        :[id number]}    No orders of the defined types were placed in this encounter.    F/u in ***  Signed, Cody Das, MD

## 2023-09-15 NOTE — Telephone Encounter (Signed)
 I will update the requesting office the pt no showed for his new pt appt with Dr. Filiberto Hug today.   Pt will need to call back and schedule a new pt appt for preop clearance. Pt needs to call [phone] and tell the operator he needs a new pt appt for preop clearance.
